# Patient Record
Sex: Female | Born: 1980 | Race: Black or African American | Hispanic: No | Marital: Single | Smoking: Current every day smoker
Health system: Southern US, Community
[De-identification: ages and names within clinical notes are randomized; demographics above are authoritative.]

## PROBLEM LIST (undated history)

## (undated) DIAGNOSIS — IMO0001 Reserved for inherently not codable concepts without codable children: Secondary | ICD-10-CM

## (undated) DIAGNOSIS — K0889 Other specified disorders of teeth and supporting structures: Secondary | ICD-10-CM

## (undated) DIAGNOSIS — N920 Excessive and frequent menstruation with regular cycle: Secondary | ICD-10-CM

## (undated) DIAGNOSIS — G039 Meningitis, unspecified: Secondary | ICD-10-CM

## (undated) HISTORY — PX: NO PAST SURGERIES: SHX2092

## (undated) HISTORY — PX: UTERINE FIBROID SURGERY: SHX826

---

## 2003-12-25 ENCOUNTER — Emergency Department: Payer: Self-pay | Admitting: Emergency Medicine

## 2004-03-05 ENCOUNTER — Emergency Department: Payer: Self-pay | Admitting: Internal Medicine

## 2004-10-02 ENCOUNTER — Emergency Department: Payer: Self-pay | Admitting: Internal Medicine

## 2004-10-03 ENCOUNTER — Emergency Department: Payer: Self-pay | Admitting: Emergency Medicine

## 2005-05-25 ENCOUNTER — Emergency Department: Payer: Self-pay | Admitting: Emergency Medicine

## 2005-10-19 ENCOUNTER — Emergency Department: Payer: Self-pay | Admitting: Emergency Medicine

## 2005-10-30 ENCOUNTER — Emergency Department: Payer: Self-pay | Admitting: Unknown Physician Specialty

## 2005-10-30 ENCOUNTER — Other Ambulatory Visit: Payer: Self-pay

## 2005-11-23 ENCOUNTER — Emergency Department: Payer: Self-pay | Admitting: Emergency Medicine

## 2006-03-04 ENCOUNTER — Emergency Department: Payer: Self-pay | Admitting: Internal Medicine

## 2006-03-04 ENCOUNTER — Other Ambulatory Visit: Payer: Self-pay

## 2006-10-24 ENCOUNTER — Emergency Department: Payer: Self-pay | Admitting: Emergency Medicine

## 2007-01-17 ENCOUNTER — Emergency Department: Payer: Self-pay | Admitting: Emergency Medicine

## 2007-08-26 ENCOUNTER — Emergency Department: Payer: Self-pay | Admitting: Unknown Physician Specialty

## 2008-12-10 ENCOUNTER — Emergency Department: Payer: Self-pay | Admitting: Internal Medicine

## 2009-03-06 ENCOUNTER — Emergency Department: Payer: Self-pay | Admitting: Emergency Medicine

## 2009-09-27 ENCOUNTER — Emergency Department: Payer: Self-pay | Admitting: Emergency Medicine

## 2013-08-01 ENCOUNTER — Emergency Department: Payer: Self-pay | Admitting: Internal Medicine

## 2013-08-01 LAB — URINALYSIS, COMPLETE
Bilirubin,UR: NEGATIVE
GLUCOSE, UR: NEGATIVE mg/dL (ref 0–75)
Nitrite: NEGATIVE
Ph: 5 (ref 4.5–8.0)
RBC,UR: 5 /HPF (ref 0–5)
SPECIFIC GRAVITY: 1.029 (ref 1.003–1.030)
Squamous Epithelial: 1

## 2013-08-01 LAB — COMPREHENSIVE METABOLIC PANEL
ANION GAP: 7 (ref 7–16)
Albumin: 3.5 g/dL (ref 3.4–5.0)
Alkaline Phosphatase: 69 U/L
BUN: 13 mg/dL (ref 7–18)
Bilirubin,Total: 0.3 mg/dL (ref 0.2–1.0)
CALCIUM: 8.4 mg/dL — AB (ref 8.5–10.1)
CHLORIDE: 108 mmol/L — AB (ref 98–107)
Co2: 22 mmol/L (ref 21–32)
Creatinine: 1.04 mg/dL (ref 0.60–1.30)
EGFR (Non-African Amer.): >60
GLUCOSE: 103 mg/dL — AB (ref 65–99)
OSMOLALITY: 274 (ref 275–301)
Potassium: 3.9 mmol/L (ref 3.5–5.1)
SGOT(AST): 25 U/L (ref 15–37)
SGPT (ALT): 25 U/L (ref 12–78)
SODIUM: 137 mmol/L (ref 136–145)
Total Protein: 7.7 g/dL (ref 6.4–8.2)

## 2013-08-01 LAB — DRUG SCREEN, URINE
AMPHETAMINES, UR SCREEN: NEGATIVE (ref ?–1000)
Barbiturates, Ur Screen: NEGATIVE (ref ?–200)
Benzodiazepine, Ur Scrn: NEGATIVE (ref ?–200)
Cannabinoid 50 Ng, Ur ~~LOC~~: POSITIVE (ref ?–50)
Cocaine Metabolite,Ur ~~LOC~~: NEGATIVE (ref ?–300)
MDMA (Ecstasy)Ur Screen: NEGATIVE (ref ?–500)
METHADONE, UR SCREEN: NEGATIVE (ref ?–300)
Opiate, Ur Screen: NEGATIVE (ref ?–300)
PHENCYCLIDINE (PCP) UR S: NEGATIVE (ref ?–25)
Tricyclic, Ur Screen: NEGATIVE (ref ?–1000)

## 2013-08-01 LAB — CBC
HCT: 39 % (ref 35.0–47.0)
HGB: 12.7 g/dL (ref 12.0–16.0)
MCH: 27.1 pg (ref 26.0–34.0)
MCHC: 32.6 g/dL (ref 32.0–36.0)
MCV: 83 fL (ref 80–100)
Platelet: 281 10*3/uL (ref 150–440)
RBC: 4.69 10*6/uL (ref 3.80–5.20)
RDW: 15.6 % — ABNORMAL HIGH (ref 11.5–14.5)
WBC: 6.6 10*3/uL (ref 3.6–11.0)

## 2013-08-01 LAB — LIPASE, BLOOD: Lipase: 159 U/L (ref 73–393)

## 2014-03-12 ENCOUNTER — Emergency Department: Payer: Self-pay | Admitting: Emergency Medicine

## 2015-01-07 ENCOUNTER — Emergency Department
Admission: EM | Admit: 2015-01-07 | Discharge: 2015-01-07 | Disposition: A | Discharge status: Home / Self Care | Payer: Self-pay | Attending: Emergency Medicine | Admitting: Emergency Medicine

## 2015-01-07 ENCOUNTER — Emergency Department: Payer: Self-pay

## 2015-01-07 ENCOUNTER — Encounter: Payer: Self-pay | Admitting: Emergency Medicine

## 2015-01-07 DIAGNOSIS — F17200 Nicotine dependence, unspecified, uncomplicated: Secondary | ICD-10-CM | POA: Insufficient documentation

## 2015-01-07 DIAGNOSIS — R109 Unspecified abdominal pain: Secondary | ICD-10-CM | POA: Insufficient documentation

## 2015-01-07 DIAGNOSIS — G44219 Episodic tension-type headache, not intractable: Secondary | ICD-10-CM | POA: Insufficient documentation

## 2015-01-07 DIAGNOSIS — Z3202 Encounter for pregnancy test, result negative: Secondary | ICD-10-CM | POA: Insufficient documentation

## 2015-01-07 LAB — URINALYSIS COMPLETE WITH MICROSCOPIC (ARMC ONLY)
Bacteria, UA: NONE SEEN
Bilirubin Urine: NEGATIVE
Glucose, UA: NEGATIVE mg/dL
Hgb urine dipstick: NEGATIVE
Ketones, ur: NEGATIVE mg/dL
Nitrite: NEGATIVE
Protein, ur: NEGATIVE mg/dL
Specific Gravity, Urine: 1.018 (ref 1.005–1.030)
pH: 6 (ref 5.0–8.0)

## 2015-01-07 LAB — COMPREHENSIVE METABOLIC PANEL
ALBUMIN: 3.7 g/dL (ref 3.5–5.0)
ALT: 19 U/L (ref 14–54)
ANION GAP: 4 — AB (ref 5–15)
AST: 20 U/L (ref 15–41)
Alkaline Phosphatase: 58 U/L (ref 38–126)
BUN: 11 mg/dL (ref 6–20)
CHLORIDE: 108 mmol/L (ref 101–111)
CO2: 26 mmol/L (ref 22–32)
Calcium: 8.8 mg/dL — ABNORMAL LOW (ref 8.9–10.3)
Creatinine, Ser: 0.86 mg/dL (ref 0.44–1.00)
GFR calc Af Amer: >60 mL/min (ref >60)
GFR calc non Af Amer: >60 mL/min (ref >60)
GLUCOSE: 97 mg/dL (ref 65–99)
POTASSIUM: 3.7 mmol/L (ref 3.5–5.1)
SODIUM: 138 mmol/L (ref 135–145)
Total Bilirubin: 0.3 mg/dL (ref 0.3–1.2)
Total Protein: 7.2 g/dL (ref 6.5–8.1)

## 2015-01-07 LAB — CBC
HEMATOCRIT: 32.1 % — AB (ref 35.0–47.0)
HEMOGLOBIN: 10 g/dL — AB (ref 12.0–16.0)
MCH: 23.5 pg — ABNORMAL LOW (ref 26.0–34.0)
MCHC: 31.2 g/dL — AB (ref 32.0–36.0)
MCV: 75.3 fL — AB (ref 80.0–100.0)
Platelets: 308 10*3/uL (ref 150–440)
RBC: 4.26 MIL/uL (ref 3.80–5.20)
RDW: 16.8 % — AB (ref 11.5–14.5)
WBC: 4.1 10*3/uL (ref 3.6–11.0)

## 2015-01-07 LAB — POCT PREGNANCY, URINE: Preg Test, Ur: NEGATIVE

## 2015-01-07 LAB — LIPASE, BLOOD: Lipase: 30 U/L (ref 11–51)

## 2015-01-07 MED ORDER — BUTALBITAL-APAP-CAFFEINE 50-325-40 MG PO TABS
2.0000 | ORAL_TABLET | Freq: Once | ORAL | Status: AC
Start: 2015-01-07 — End: 2015-01-07
  Administered 2015-01-07: 2 via ORAL
  Filled 2015-01-07: qty 2

## 2015-01-07 MED ORDER — KETOROLAC TROMETHAMINE 60 MG/2ML IM SOLN
60.0000 mg | Freq: Once | INTRAMUSCULAR | Status: AC
  Administered 2015-01-07: 60 mg via INTRAMUSCULAR
  Filled 2015-01-07: qty 2

## 2015-01-07 MED ORDER — BUTALBITAL-APAP-CAFFEINE 50-325-40 MG PO TABS: 1.0000 | ORAL_TABLET | Freq: Four times a day (QID) | ORAL | Status: DC | PRN

## 2015-01-07 MED ORDER — IOHEXOL 240 MG/ML SOLN
25.0000 mL | Freq: Once | INTRAMUSCULAR | Status: AC | PRN
  Administered 2015-01-07: 25 mL via ORAL
  Filled 2015-01-07: qty 25

## 2015-01-07 MED ORDER — IOHEXOL 300 MG/ML  SOLN
100.0000 mL | Freq: Once | INTRAMUSCULAR | Status: AC | PRN
  Administered 2015-01-07: 100 mL via INTRAVENOUS
  Filled 2015-01-07: qty 100

## 2015-01-07 NOTE — ED Notes (Signed)
Patient transported to CT 

## 2015-01-07 NOTE — ED Provider Notes (Signed)
[facility] Emergency Department Provider Note  ____________________________________________  Time seen: Approximately 3:33 PM  I have reviewed the triage vital signs and the nursing notes.   HISTORY  Chief Complaint Headache   HPI Kendra Higgins is a 34 y.o. female presents for evaluation of a headache times several weeks. Patient states that she's tried Advil with no relief denies any nausea vomiting no dizziness no change of vision. In addition patient states that she's got this abdominal mass that she's noted for the past year. Denies any change in bowel movements denies being pregnant.   History reviewed. No pertinent past medical history.  There are no active problems to display for this patient.   History reviewed. No pertinent past surgical history.  No current outpatient prescriptions on file.  Allergies Tramadol  Family History  Problem Relation Age of Onset  . CVA Mother     Social History Social History  Substance Use Topics  . Smoking status: Current Every Day Smoker  . Smokeless tobacco: None  . Alcohol Use: No    Review of Systems Constitutional: No fever/chills Eyes: No visual changes. ENT: No sore throat. Cardiovascular: Denies chest pain. Respiratory: Denies shortness of breath. Gastrointestinal: No abdominal pain.  No nausea, no vomiting.  No diarrhea.  No constipation. Positive for l"Knot on the right side of her abdomen". Genitourinary: Negative for dysuria. Musculoskeletal: Negative for back pain. Skin: Negative for rash. Neurological: Negative for headaches, focal weakness or numbness.  10-point ROS otherwise negative.  ____________________________________________   PHYSICAL EXAM:  VITAL SIGNS: ED Triage Vitals  Enc Vitals Group     BP 01/07/15 1427 160/104 mmHg     Pulse Rate 01/07/15 1427 90     Resp 01/07/15 1427 20     Temp 01/07/15 1427 98.2 F (36.8 C)     Temp Source 01/07/15 1427 Oral     SpO2  01/07/15 1427 98 %     Weight 01/07/15 1427 201 lb (91.173 kg)     Height 01/07/15 1427 5\' 5"  (1.651 m)     Head Cir --      Peak Flow --      Pain Score 01/07/15 1428 7     Pain Loc --      Pain Edu? --      Excl. in [place]? --     Constitutional: Alert and oriented. Well appearing and in no acute distress. Eyes: Conjunctivae are normal. PERRL. EOMI. Head: Atraumatic. Nose: No congestion/rhinnorhea. Mouth/Throat: Mucous membranes are moist.  Oropharynx non-erythematous. Neck: No stridor.   Cardiovascular: Normal rate, regular rhythm. Grossly normal heart sounds.  Good peripheral circulation. Respiratory: Normal respiratory effort.  No retractions. Lungs CTAB. Gastrointestinal: Soft and nontender. No distention. No abdominal bruits. No CVA tenderness. Complete exam limited due to body habitus. Musculoskeletal: No lower extremity tenderness nor edema.  No joint effusions. Neurologic:  Normal speech and language. No gross focal neurologic deficits are appreciated. No gait instability. Skin:  Skin is warm, dry and intact. No rash noted. Psychiatric: Mood and affect are normal. Speech and behavior are normal.  ____________________________________________   LABS (all labs ordered are listed, but only abnormal results are displayed)  Labs Reviewed  COMPREHENSIVE METABOLIC PANEL - Abnormal; Notable for the following:    Calcium 8.8 (*)    Anion gap 4 (*)    All other components within normal limits  CBC - Abnormal; Notable for the following:    Hemoglobin 10.0 (*)    HCT  32.1 (*)    MCV 75.3 (*)    MCH 23.5 (*)    MCHC 31.2 (*)    RDW 16.8 (*)    All other components within normal limits  URINALYSIS COMPLETEWITH MICROSCOPIC ([facility] ONLY) - Abnormal; Notable for the following:    Color, Urine YELLOW (*)    APPearance CLEAR (*)    Leukocytes, UA TRACE (*)    Squamous Epithelial / LPF 0-5 (*)    All other components within normal limits  LIPASE, BLOOD  POC URINE PREG, ED  POCT  PREGNANCY, URINE   ____________________________________________  RADIOLOGY  Abdomen 2 view demonstrates either an enlarged uterus or mass. No differential given. Will obtain an abdomen CT with contrast  .IMPRESSION: Abdomen CT with contrast Bulky uterus consistent with uterine fibroid change. This corresponds to the density seen on the recent plain film examination.   ____________________________________________   PROCEDURES  Procedure(s) performed: None  Critical Care performed: No  ____________________________________________   INITIAL IMPRESSION / ASSESSMENT AND PLAN / ED COURSE  Pertinent labs & imaging results that were available during my care of the patient were reviewed by me and considered in my medical decision making (see chart for details).  Tension headache resolved prior to discharged after Fioricet. Uterine fibroids noted by CT exam patient follow-up with OB/GYN as needed. Patient voices no other emergency medical complaints at this time. Rx given for Fioricet. ____________________________________________   FINAL CLINICAL IMPRESSION(S) / ED DIAGNOSES  Final diagnoses:  None      Arlyss Repress, PA-C 01/07/15 1726  Nance Pear, MD 01/07/15 1728

## 2015-01-07 NOTE — Discharge Instructions (Signed)
Tension Headache A tension headache is a feeling of pain, pressure, or aching that is often felt over the front and sides of the head. The pain can be dull, or it can feel tight (constricting). Tension headaches are not normally associated with nausea or vomiting, and they do not get worse with physical activity. Tension headaches can last from 30 minutes to several days. This is the most common type of headache. CAUSES The exact cause of this condition is not known. Tension headaches often begin after stress, anxiety, or depression. Other triggers may include:  Alcohol.  Too much caffeine, or caffeine withdrawal.  Respiratory infections, such as colds, flu, or sinus infections.  Dental problems or teeth clenching.  Fatigue.  Holding your head and neck in the same position for a long period of time, such as while using a computer.  Smoking. SYMPTOMS Symptoms of this condition include:  A feeling of pressure around the head.  Dull, aching head pain.  Pain felt over the front and sides of the head.  Tenderness in the muscles of the head, neck, and shoulders. DIAGNOSIS This condition may be diagnosed based on your symptoms and a physical exam. Tests may be done, such as a CT scan or an MRI of your head. These tests may be done if your symptoms are severe or unusual. TREATMENT This condition may be treated with lifestyle changes and medicines to help relieve symptoms. HOME CARE INSTRUCTIONS Managing Pain  Take over-the-counter and prescription medicines only as told by your health care provider.  Lie down in a dark, quiet room when you have a headache.  If directed, apply ice to the head and neck area:  Put ice in a plastic bag.  Place a towel between your skin and the bag.  Leave the ice on for 20 minutes, 2-3 times per day.  Use a heating pad or a hot shower to apply heat to the head and neck area as told by your health care provider. Eating and Drinking  Eat meals on  a regular schedule.  Limit alcohol use.  Decrease your caffeine intake, or stop using caffeine. General Instructions  Keep all follow-up visits as told by your health care provider. This is important.  Keep a headache journal to help find out what may trigger your headaches. For example, write down:  What you eat and drink.  How much sleep you get.  Any change to your diet or medicines.  Try massage or other relaxation techniques.  Limit stress.  Sit up straight, and avoid tensing your muscles.  Do not use tobacco products, including cigarettes, chewing tobacco, or e-cigarettes. If you need help quitting, ask your health care provider.  Exercise regularly as told by your health care provider.  Get 7-9 hours of sleep, or the amount recommended by your health care provider. SEEK MEDICAL CARE IF:  Your symptoms are not helped by medicine.  You have a headache that is different from what you normally experience.  You have nausea or you vomit.  You have a fever. SEEK IMMEDIATE MEDICAL CARE IF:  Your headache becomes severe.  You have repeated vomiting.  You have a stiff neck.  You have a loss of vision.  You have problems with speech.  You have pain in your eye or ear.  You have muscular weakness or loss of muscle control.  You lose your balance or you have trouble walking.  You feel faint or you pass out.  You have confusion.     This information is not intended to replace advice given to you by your health care provider. Make sure you discuss any questions you have with your health care provider.   Document Released: 01/04/2005 Document Revised: 09/25/2014 Document Reviewed: 04/29/2014 Elsevier Interactive Patient Education 2016 [employer]  

## 2015-01-07 NOTE — ED Notes (Signed)
Pt to ed with c/o headache x several weeks.  Pt states she has tried advil without relief.  Pt also reports she has a knot in her right side of her abd that she would like to have checked as well.

## 2015-06-19 ENCOUNTER — Encounter: Payer: Self-pay | Admitting: Emergency Medicine

## 2015-06-19 DIAGNOSIS — R110 Nausea: Secondary | ICD-10-CM | POA: Insufficient documentation

## 2015-06-19 DIAGNOSIS — R1011 Right upper quadrant pain: Secondary | ICD-10-CM | POA: Insufficient documentation

## 2015-06-19 DIAGNOSIS — D259 Leiomyoma of uterus, unspecified: Secondary | ICD-10-CM | POA: Insufficient documentation

## 2015-06-19 DIAGNOSIS — F17200 Nicotine dependence, unspecified, uncomplicated: Secondary | ICD-10-CM | POA: Insufficient documentation

## 2015-06-19 LAB — POCT PREGNANCY, URINE: Preg Test, Ur: NEGATIVE

## 2015-06-19 LAB — COMPREHENSIVE METABOLIC PANEL
ALK PHOS: 67 U/L (ref 38–126)
ALT: 16 U/L (ref 14–54)
AST: 21 U/L (ref 15–41)
Albumin: 4.3 g/dL (ref 3.5–5.0)
Anion gap: 11 (ref 5–15)
BILIRUBIN TOTAL: 0.4 mg/dL (ref 0.3–1.2)
BUN: 16 mg/dL (ref 6–20)
CHLORIDE: 102 mmol/L (ref 101–111)
CO2: 23 mmol/L (ref 22–32)
CREATININE: 0.86 mg/dL (ref 0.44–1.00)
Calcium: 9.9 mg/dL (ref 8.9–10.3)
Glucose, Bld: 148 mg/dL — ABNORMAL HIGH (ref 65–99)
Potassium: 3.8 mmol/L (ref 3.5–5.1)
Sodium: 136 mmol/L (ref 135–145)
Total Protein: 9.1 g/dL — ABNORMAL HIGH (ref 6.5–8.1)

## 2015-06-19 LAB — LIPASE, BLOOD: LIPASE: 26 U/L (ref 11–51)

## 2015-06-19 LAB — CBC
HEMATOCRIT: 32.3 % — AB (ref 35.0–47.0)
Hemoglobin: 10.2 g/dL — ABNORMAL LOW (ref 12.0–16.0)
MCH: 21.3 pg — AB (ref 26.0–34.0)
MCHC: 31.7 g/dL — AB (ref 32.0–36.0)
MCV: 67.4 fL — AB (ref 80.0–100.0)
PLATELETS: 415 10*3/uL (ref 150–440)
RBC: 4.78 MIL/uL (ref 3.80–5.20)
RDW: 19.4 % — AB (ref 11.5–14.5)
WBC: 7.1 10*3/uL (ref 3.6–11.0)

## 2015-06-19 NOTE — ED Notes (Addendum)
Pt presents to ED with c/o emesis then epigastric pain when vomiting, onset 3 days. Pt also reports cough with yellow and green sputum. Denies diarrhea. State in period now.

## 2015-06-20 ENCOUNTER — Emergency Department: Payer: Self-pay

## 2015-06-20 ENCOUNTER — Emergency Department
Admission: EM | Admit: 2015-06-20 | Discharge: 2015-06-20 | Disposition: A | Discharge status: Home / Self Care | Payer: Self-pay | Attending: Emergency Medicine | Admitting: Emergency Medicine

## 2015-06-20 ENCOUNTER — Encounter: Payer: Self-pay | Admitting: Radiology

## 2015-06-20 DIAGNOSIS — R110 Nausea: Secondary | ICD-10-CM

## 2015-06-20 DIAGNOSIS — R1011 Right upper quadrant pain: Secondary | ICD-10-CM

## 2015-06-20 DIAGNOSIS — D259 Leiomyoma of uterus, unspecified: Secondary | ICD-10-CM

## 2015-06-20 LAB — URINALYSIS COMPLETE WITH MICROSCOPIC (ARMC ONLY)
BILIRUBIN URINE: NEGATIVE
GLUCOSE, UA: 50 mg/dL — AB
Hgb urine dipstick: NEGATIVE
NITRITE: NEGATIVE
Protein, ur: 100 mg/dL — AB
RBC / HPF: NONE SEEN RBC/hpf (ref 0–5)
SPECIFIC GRAVITY, URINE: 1.03 (ref 1.005–1.030)
WBC, UA: NONE SEEN WBC/hpf (ref 0–5)
pH: 5 (ref 5.0–8.0)

## 2015-06-20 MED ORDER — OXYCODONE-ACETAMINOPHEN 5-325 MG PO TABS: 1.0000 | ORAL_TABLET | ORAL | Status: DC | PRN

## 2015-06-20 MED ORDER — ONDANSETRON 4 MG PO TBDP: 4.0000 mg | ORAL_TABLET | Freq: Three times a day (TID) | ORAL | Status: DC | PRN

## 2015-06-20 MED ORDER — MORPHINE SULFATE (PF) 2 MG/ML IV SOLN
2.0000 mg | Freq: Once | INTRAVENOUS | Status: AC
  Administered 2015-06-20: 2 mg via INTRAVENOUS
  Filled 2015-06-20: qty 1

## 2015-06-20 MED ORDER — DIATRIZOATE MEGLUMINE & SODIUM 66-10 % PO SOLN
15.0000 mL | Freq: Once | ORAL | Status: AC
  Administered 2015-06-20: 15 mL via ORAL

## 2015-06-20 MED ORDER — IOPAMIDOL (ISOVUE-300) INJECTION 61%
100.0000 mL | Freq: Once | INTRAVENOUS | Status: AC | PRN
  Administered 2015-06-20: 100 mL via INTRAVENOUS

## 2015-06-20 MED ORDER — ONDANSETRON HCL 4 MG/2ML IJ SOLN
4.0000 mg | Freq: Once | INTRAMUSCULAR | Status: AC
  Administered 2015-06-20: 4 mg via INTRAVENOUS
  Filled 2015-06-20: qty 2

## 2015-06-20 NOTE — Discharge Instructions (Signed)
Uterine Fibroids Uterine fibroids are tissue masses (tumors) that can develop in the womb (uterus). They are also called leiomyomas. This type of tumor is not cancerous (benign) and does not spread to other parts of the body outside of the pelvic area, which is between the hip bones. Occasionally, fibroids may develop in the fallopian tubes, in the cervix, or on the support structures (ligaments) that surround the uterus. You can have one or many fibroids. Fibroids can vary in size, weight, and where they grow in the uterus. Some can become quite large. Most fibroids do not require medical treatment. CAUSES A fibroid can develop when a single uterine cell keeps growing (replicating). Most cells in the human body have a control mechanism that keeps them from replicating without control. SIGNS AND SYMPTOMS Symptoms may include:   Heavy bleeding during your period.  Bleeding or spotting between periods.  Pelvic pain and pressure.  Bladder problems, such as needing to urinate more often (urinary frequency) or urgently.  Inability to reproduce offspring (infertility).  Miscarriages. DIAGNOSIS Uterine fibroids are diagnosed through a physical exam. Your health care provider may feel the lumpy tumors during a pelvic exam. Ultrasonography and an MRI may be done to determine the size, location, and number of fibroids. TREATMENT Treatment may include:  Watchful waiting. This involves getting the fibroid checked by your health care provider to see if it grows or shrinks. Follow your health care provider's recommendations for how often to have this checked.  Hormone medicines. These can be taken by mouth or given through an intrauterine device (IUD).  Surgery.  Removing the fibroids (myomectomy) or the uterus (hysterectomy).  Removing blood supply to the fibroids (uterine artery embolization). If fibroids interfere with your fertility and you want to become pregnant, your health care provider  may recommend having the fibroids removed.  HOME CARE INSTRUCTIONS  Keep all follow-up visits as directed by your health care provider. This is important.  Take medicines only as directed by your health care provider.  If you were prescribed a hormone treatment, take the hormone medicines exactly as directed.  Do not take aspirin, because it can cause bleeding.  Ask your health care provider about taking iron pills and increasing the amount of dark green, leafy vegetables in your diet. These actions can help to boost your blood iron levels, which may be affected by heavy menstrual bleeding.  Pay close attention to your period and tell your health care provider about any changes, such as:  Increased blood flow that requires you to use more pads or tampons than usual per month.  A change in the number of days that your period lasts per month.  A change in symptoms that are associated with your period, such as abdominal cramping or back pain. SEEK MEDICAL CARE IF:  You have pelvic pain, back pain, or abdominal cramps that cannot be controlled with medicines.  You have an increase in bleeding between and during periods.  You soak tampons or pads in a half hour or less.  You feel lightheaded, extra tired, or weak. SEEK IMMEDIATE MEDICAL CARE IF:  You faint.  You have a sudden increase in pelvic pain.   This information is not intended to replace advice given to you by your health care provider. Make sure you discuss any questions you have with your health care provider.   Document Released: 01/02/2000 Document Revised: 01/25/2014 Document Reviewed: 07/03/2013 Elsevier Interactive Patient Education 2016 Elsevier Inc.  

## 2015-06-20 NOTE — ED Provider Notes (Signed)
[facility] Emergency Department Provider Note  ____________________________________________  Time seen: 1:30 AM  I have reviewed the triage vital signs and the nursing notes.   HISTORY  Chief Complaint Emesis     HPI Anglea Chauncey Cruel Higgins is a 35 y.o. female presents with suprapubic abdominal pain accompanied by vomiting 3 days. Patient denies any diarrhea no fever. Patient denies any dysuria or urinary frequency or urgency. Patient denies any diarrhea. Of note patient's currently menstruating     Past medical history None There are no active problems to display for this patient.   Past surgical history No pertinent past surgical history  Current Outpatient Rx  Name  Route  Sig  Dispense  Refill  . ondansetron (ZOFRAN ODT) 4 MG disintegrating tablet   Oral   Take 1 tablet (4 mg total) by mouth every 8 (eight) hours as needed for nausea or vomiting.   20 tablet   0   . oxyCODONE-acetaminophen (ROXICET) 5-325 MG tablet   Oral   Take 1 tablet by mouth every 4 (four) hours as needed for severe pain.   20 tablet   0     Allergies Tramadol  Family History  Problem Relation Age of Onset  . CVA Mother     Social History Social History  Substance Use Topics  . Smoking status: Current Every Day Smoker  . Smokeless tobacco: None  . Alcohol Use: No    Review of Systems  Constitutional: Negative for fever. Eyes: Negative for visual changes. ENT: Negative for sore throat. Cardiovascular: Negative for chest pain. Respiratory: Negative for shortness of breath. Gastrointestinal: Positive for pelvic pain and vomiting Genitourinary: Negative for dysuria. Musculoskeletal: Negative for back pain. Skin: Negative for rash. Neurological: Negative for headaches, focal weakness or numbness.   10-point ROS otherwise negative.  ____________________________________________   PHYSICAL EXAM:  VITAL SIGNS: ED Triage Vitals  Enc Vitals Group     BP  06/19/15 2312 149/81 mmHg     Pulse Rate 06/19/15 2312 99     Resp 06/19/15 2312 18     Temp 06/19/15 2312 98.5 F (36.9 C)     Temp Source 06/19/15 2312 Oral     SpO2 06/19/15 2312 98 %     Weight --      Height --      Head Cir --      Peak Flow --      Pain Score 06/19/15 2318 0     Pain Loc --      Pain Edu? --      Excl. in [place]? --      Constitutional: Alert and oriented. Well appearing and in no distress. Eyes: Conjunctivae are normal. PERRL. Normal extraocular movements. ENT   Head: Normocephalic and atraumatic.   Nose: No congestion/rhinnorhea.   Mouth/Throat: Mucous membranes are moist.   Neck: No stridor. Hematological/Lymphatic/Immunilogical: No cervical lymphadenopathy. Cardiovascular: Normal rate, regular rhythm. Normal and symmetric distal pulses are present in all extremities. No murmurs, rubs, or gallops. Respiratory: Normal respiratory effort without tachypnea nor retractions. Breath sounds are clear and equal bilaterally. No wheezes/rales/rhonchi. Gastrointestinal: Suprapubic discomfort with palpation. No distention. There is no CVA tenderness. Genitourinary: deferred Musculoskeletal: Nontender with normal range of motion in all extremities. No joint effusions.  No lower extremity tenderness nor edema. Neurologic:  Normal speech and language. No gross focal neurologic deficits are appreciated. Speech is normal.  Skin:  Skin is warm, dry and intact. No rash noted. Psychiatric: Mood and affect  are normal. Speech and behavior are normal. Patient exhibits appropriate insight and judgment.  ____________________________________________    LABS (pertinent positives/negatives)  Labs Reviewed  COMPREHENSIVE METABOLIC PANEL - Abnormal; Notable for the following:    Glucose, Bld 148 (*)    Total Protein 9.1 (*)    All other components within normal limits  CBC - Abnormal; Notable for the following:    Hemoglobin 10.2 (*)    HCT 32.3 (*)    MCV 67.4  (*)    MCH 21.3 (*)    MCHC 31.7 (*)    RDW 19.4 (*)    All other components within normal limits  URINALYSIS COMPLETEWITH MICROSCOPIC ([facility] ONLY) - Abnormal; Notable for the following:    Color, Urine YELLOW (*)    APPearance TURBID (*)    Glucose, UA 50 (*)    Ketones, ur 1+ (*)    Protein, ur 100 (*)    Leukocytes, UA TRACE (*)    Bacteria, UA RARE (*)    Squamous Epithelial / LPF 0-5 (*)    All other components within normal limits  LIPASE, BLOOD  POC URINE PREG, ED  POCT PREGNANCY, URINE         RADIOLOGY      CT Abdomen Pelvis W Contrast (Final result) Result time: 06/20/15 03:33:35   Final result by Rad Results In Interface (06/20/15 03:33:35)   Narrative:   CLINICAL DATA: Acute onset of epigastric abdominal pain and vomiting. Productive cough. Initial encounter.  EXAM: CT ABDOMEN AND PELVIS WITH CONTRAST  TECHNIQUE: Multidetector CT imaging of the abdomen and pelvis was performed using the standard protocol following bolus administration of intravenous contrast.  CONTRAST: 199mL ISOVUE-300 IOPAMIDOL (ISOVUE-300) INJECTION 61%  COMPARISON: CT of the abdomen and pelvis from 01/07/2015, and right upper quadrant ultrasound performed earlier today at 2:15 a.m.  FINDINGS: The visualized lung bases are clear.  The liver and spleen are unremarkable in appearance. The gallbladder is within normal limits. The pancreas and adrenal glands are unremarkable.  The kidneys are unremarkable in appearance. There is no evidence of hydronephrosis. No renal or ureteral stones are seen. No perinephric stranding is appreciated.  No free fluid is identified. The small bowel is unremarkable in appearance. The stomach is within normal limits. No acute vascular abnormalities are seen.  The appendix is normal in caliber and contains air, without evidence of appendicitis. The colon is grossly unremarkable in appearance.  The bladder is mildly distended. The uterus  is significantly enlarged, extending above the level of the umbilicus, with multiple large uterine fibroids. Some of these demonstrate new degeneration since the prior study. The ovaries are grossly unremarkable in appearance. A tampon is noted at the vagina. No inguinal lymphadenopathy is seen.  No acute osseous abnormalities are identified.  IMPRESSION: 1. No acute abnormality seen within the abdomen or pelvis. 2. Significantly enlarged uterus, extending above the level of the umbilicus, with multiple large uterine fibroids. Some of these demonstrate new degeneration since the prior study.   Electronically Signed By: Garald Balding M.D. On: 06/20/2015 03:33          US Abdomen Limited RUQ (Final result) Result time: 06/20/15 02:43:26   Final result by Rad Results In Interface (06/20/15 02:43:26)   Narrative:   CLINICAL DATA: Right upper quadrant pain and nausea.  EXAM: US ABDOMEN LIMITED - RIGHT UPPER QUADRANT  COMPARISON: CT 01/07/2015  FINDINGS: Gallbladder:  Physiologically distended. No gallstones or wall thickening visualized. No sonographic Murphy sign noted by sonographer.  Common bile duct:  Diameter: 2.8 mm, normal.  Liver:  No focal lesion identified. Within normal limits in parenchymal echogenicity. Normal directional flow in the main portal vein.  IMPRESSION: Normal right upper quadrant ultrasound. No gallstones.   Electronically Signed By: Jeb Levering M.D. On: 06/20/2015 02:43          INITIAL IMPRESSION / ASSESSMENT AND PLAN / ED COURSE  Pertinent labs & imaging results that were available during my care of the patient were reviewed by me and considered in my medical decision making (see chart for details).  Patient informed of all clinical findings including large uterine fibroids. Patient referred to Dr. Star [facility] for further outpatient evaluation and  management  ____________________________________________   FINAL CLINICAL IMPRESSION(S) / ED DIAGNOSES  Final diagnoses:  Nausea  Right upper quadrant pain  Uterine leiomyoma, unspecified location      Gregor Hams, MD 06/20/15 [phone]

## 2015-06-20 NOTE — ED Notes (Signed)
Patient transported to Ultrasound 

## 2015-08-13 ENCOUNTER — Encounter
Admission: RE | Admit: 2015-08-13 | Discharge: 2015-08-13 | Disposition: A | Discharge status: Home / Self Care | Payer: Self-pay | Source: Ambulatory Visit | Attending: Obstetrics & Gynecology | Admitting: Obstetrics & Gynecology

## 2015-08-13 ENCOUNTER — Other Ambulatory Visit: Payer: Self-pay

## 2015-08-13 DIAGNOSIS — Z01810 Encounter for preprocedural cardiovascular examination: Secondary | ICD-10-CM | POA: Insufficient documentation

## 2015-08-13 DIAGNOSIS — Z01812 Encounter for preprocedural laboratory examination: Secondary | ICD-10-CM | POA: Insufficient documentation

## 2015-08-13 HISTORY — DX: Reserved for inherently not codable concepts without codable children: IMO0001

## 2015-08-13 HISTORY — DX: Excessive and frequent menstruation with regular cycle: N92.0

## 2015-08-13 HISTORY — DX: Other specified disorders of teeth and supporting structures: K08.89

## 2015-08-13 LAB — CBC
HEMATOCRIT: 28 % — AB (ref 35.0–47.0)
HEMOGLOBIN: 8.8 g/dL — AB (ref 12.0–16.0)
MCH: 21 pg — AB (ref 26.0–34.0)
MCHC: 31.4 g/dL — AB (ref 32.0–36.0)
MCV: 66.9 fL — AB (ref 80.0–100.0)
Platelets: 311 10*3/uL (ref 150–440)
RBC: 4.18 MIL/uL (ref 3.80–5.20)
RDW: 20.8 % — ABNORMAL HIGH (ref 11.5–14.5)
WBC: 4.5 10*3/uL (ref 3.6–11.0)

## 2015-08-13 LAB — TYPE AND SCREEN
ABO/RH(D): O POS
Antibody Screen: NEGATIVE

## 2015-08-13 NOTE — Patient Instructions (Signed)
  Your procedure is scheduled on: 08/26/15 Tues Report to Same Day Surgery 2nd floor medical mall To find out your arrival time please call [phone] between 1PM - 3PM on 08/25/15 Mon  Remember: Instructions that are not followed completely may result in serious medical risk, up to and including death, or upon the discretion of your surgeon and anesthesiologist your surgery may need to be rescheduled.    _x___ 1. Do not eat food or drink liquids after midnight. No gum chewing or hard candies.     __x__ 2. No Alcohol for 24 hours before or after surgery.   __x__3. No Smoking for 24 prior to surgery.   ____  4. Bring all medications with you on the day of surgery if instructed.    __x__ 5. Notify your doctor if there is any change in your medical condition     (cold, fever, infections).     Do not wear jewelry, make-up, hairpins, clips or nail polish.  Do not wear lotions, powders, or perfumes. You may wear deodorant.  Do not shave 48 hours prior to surgery. Men may shave face and neck.  Do not bring valuables to the hospital.    [facility] is not responsible for any belongings or valuables.               Contacts, dentures or bridgework may not be worn into surgery.  Leave your suitcase in the car. After surgery it may be brought to your room.  For patients admitted to the hospital, discharge time is determined by your treatment team.   Patients discharged the day of surgery will not be allowed to drive home.    Please read over the following fact sheets that you were given:   [facility] Preparing for Surgery and or MRSA Information   _x___ Take these medicines the morning of surgery with A SIP OF WATER:    1. None  2.  3.  4.  5.  6.  ____ Fleet Enema (as directed)   _x___ Use CHG Soap or sage wipes as directed on instruction sheet   ____ Use inhalers on the day of surgery and bring to hospital day of surgery  ____ Stop metformin 2 days prior to surgery    ____  Take 1/2 of usual insulin dose the night before surgery and none on the morning of           surgery.   ____ Stop aspirin or coumadin, or plavix  _x__ Stop Anti-inflammatories such as Advil, Aleve, Ibuprofen, Motrin, Naproxen,          Naprosyn, Goodies powders or aspirin products. Ok to take Tylenol.   ____ Stop supplements until after surgery.    ____ Bring C-Pap to the hospital.

## 2015-08-13 NOTE — Pre-Procedure Instructions (Signed)
Received call from Ms. Bartling.  "States she will try and find a physician, make an appointment and call us back so we can send EKG and request for clearance to them.

## 2015-08-13 NOTE — Pre-Procedure Instructions (Signed)
Abnormal EKG; Dr Rosey Bath, anesthesia, notified.  Patient has no primary care physician.  Called and faxed request for clearance to Dr Kenton Kingfisher.  Called patient to inform her of need for medical clearance. Requested patient to notify pre admit testing so we could fax clearance request and EKG to a PCP.

## 2015-08-21 NOTE — Pre-Procedure Instructions (Addendum)
LEFT MESSAGE FOR NANCY AT [facility] RE STATUS OF CLEARANCE PATIENT SCHEDULED WITH [facility] BUT NO INSURANCE AND TRYING TO GET [facility]ONE[facility] TO BE SEEN. DR HARRIS DOES NOT WANT TO CANCEL UNLESS SURE CAN NOT BE CLEARED BEFORE 08/26/15

## 2015-08-25 NOTE — Pre-Procedure Instructions (Signed)
Message left on Kendra Higgins's voicemail for update on medical clearance for pt's surgery tomorrow.  No notes noted in Epic.  [name] requested to call back.

## 2015-08-25 NOTE — Pre-Procedure Instructions (Signed)
Kendra Higgins returned call, pt has not been able to see her medical doctor for clearance.  She is trying to get into see here PCP and will be rescheduled once clearance has been obtained.

## 2015-08-26 ENCOUNTER — Encounter: Admission: RE | Payer: Self-pay | Source: Ambulatory Visit

## 2015-08-26 ENCOUNTER — Inpatient Hospital Stay: Admission: RE | Admit: 2015-08-26 | Payer: Self-pay | Source: Ambulatory Visit | Admitting: Obstetrics & Gynecology

## 2015-08-26 SURGERY — MYOMECTOMY, ABDOMINAL APPROACH
Anesthesia: Choice

## 2015-10-09 ENCOUNTER — Encounter: Payer: Self-pay | Admitting: Emergency Medicine

## 2015-10-09 ENCOUNTER — Emergency Department
Admission: EM | Admit: 2015-10-09 | Discharge: 2015-10-09 | Disposition: A | Discharge status: Home / Self Care | Payer: Self-pay | Attending: Emergency Medicine | Admitting: Emergency Medicine

## 2015-10-09 DIAGNOSIS — K0889 Other specified disorders of teeth and supporting structures: Secondary | ICD-10-CM

## 2015-10-09 DIAGNOSIS — K047 Periapical abscess without sinus: Secondary | ICD-10-CM | POA: Insufficient documentation

## 2015-10-09 DIAGNOSIS — F17210 Nicotine dependence, cigarettes, uncomplicated: Secondary | ICD-10-CM | POA: Insufficient documentation

## 2015-10-09 MED ORDER — AMOXICILLIN 500 MG PO CAPS: 500.0000 mg | ORAL_CAPSULE | Freq: Three times a day (TID) | ORAL | 0 refills | Status: DC

## 2015-10-09 MED ORDER — IBUPROFEN 600 MG PO TABS: 600.0000 mg | ORAL_TABLET | Freq: Three times a day (TID) | ORAL | 0 refills | Status: DC | PRN

## 2015-10-09 MED ORDER — OXYCODONE-ACETAMINOPHEN 5-325 MG PO TABS: 1.0000 | ORAL_TABLET | ORAL | 0 refills | Status: DC | PRN

## 2015-10-09 NOTE — ED Provider Notes (Signed)
[facility] Emergency Department Provider Note   ____________________________________________   First MD Initiated Contact with Patient 10/09/15 2033     (approximate)  I have reviewed the triage vital signs and the nursing notes.   HISTORY  Chief Complaint Dental Pain    HPI Delancey Chauncey Cruel Conkey is a 35 y.o. female patient complaining of dental pain to the left lower molar for 3 days. Patient state there is some swelling in her left cheek. Patient states she contacted her treating Simona Huh and was told she couldn't get up over the next week. Patient required pain relief. Patient is rating the pain as 8/10. No palliative measures taken for this complaint. Patient described a pain as sharp.   Past Medical History:  Diagnosis Date  . Heavy menstrual bleeding   . Shortness of breath dyspnea   . Toothache     There are no active problems to display for this patient.   Past Surgical History:  Procedure Laterality Date  . NO PAST SURGERIES      Prior to Admission medications   Medication Sig Start Date End Date Taking? Authorizing Provider  amoxicillin (AMOXIL) 500 MG capsule Take 1 capsule (500 mg total) by mouth 3 (three) times daily. 10/09/15   Sable Feil, PA-C  ibuprofen (ADVIL,MOTRIN) 200 MG tablet Take 800 mg by mouth every 6 (six) hours as needed.    Historical Provider, MD  ibuprofen (ADVIL,MOTRIN) 600 MG tablet Take 1 tablet (600 mg total) by mouth every 8 (eight) hours as needed. 10/09/15   Sable Feil, PA-C  oxyCODONE-acetaminophen (ROXICET) 5-325 MG tablet Take 1 tablet by mouth every 4 (four) hours as needed for severe pain. 10/09/15   Sable Feil, PA-C    Allergies Tramadol  Family History  Problem Relation Age of Onset  . CVA Mother     Social History Social History  Substance Use Topics  . Smoking status: Current Every Day Smoker    Packs/day: 0.50    Years: 20.00    Types: Cigarettes  . Smokeless tobacco: Never Used  .  Alcohol use No    Review of Systems Constitutional: No fever/chills Eyes: No visual changes. ENT: No sore throat. Dental pain Cardiovascular: Denies chest pain. Respiratory: Denies shortness of breath. Gastrointestinal: No abdominal pain.  No nausea, no vomiting.  No diarrhea.  No constipation. Genitourinary: Negative for dysuria. Musculoskeletal: Negative for back pain. Skin: Negative for rash. Neurological: Negative for headaches, focal weakness or numbness.    ____________________________________________   PHYSICAL EXAM:  VITAL SIGNS: ED Triage Vitals  Enc Vitals Group     BP 10/09/15 1934 130/90     Pulse Rate 10/09/15 1934 (!) 104     Resp 10/09/15 1934 18     Temp 10/09/15 1934 99 F (37.2 C)     Temp Source 10/09/15 1934 Oral     SpO2 10/09/15 1934 100 %     Weight 10/09/15 1936 190 lb (86.2 kg)     Height 10/09/15 1936 5\' 4"  (1.626 m)     Head Circumference --      Peak Flow --      Pain Score 10/09/15 1954 8     Pain Loc --      Pain Edu? --      Excl. in [place]? --     Constitutional: Alert and oriented. Well appearing and in no acute distress. Eyes: Conjunctivae are normal. PERRL. EOMI. Head: Atraumatic. Nose: No congestion/rhinnorhea. Mouth/Throat: Mucous membranes  are moist.  Oropharynx erythematous, with  multiple caries and gingival edema. Neck: No stridor. No cervical spine tenderness to palpation. Hematological/Lymphatic/Immunilogical: No cervical lymphadenopathy. Cardiovascular: Normal rate, regular rhythm. Grossly normal heart sounds.  Good peripheral circulation. Respiratory: Normal respiratory effort.  No retractions. Lungs CTAB. Gastrointestinal: Soft and nontender. No distention. No abdominal bruits. No CVA tenderness. Musculoskeletal: No lower extremity tenderness nor edema.  No joint effusions. Neurologic:  Normal speech and language. No gross focal neurologic deficits are appreciated. No gait instability. Skin:  Skin is warm, dry and intact.  No rash noted. Psychiatric: Mood and affect are normal. Speech and behavior are normal.  ____________________________________________   LABS (all labs ordered are listed, but only abnormal results are displayed)  Labs Reviewed - No data to display ____________________________________________  EKG   ____________________________________________  RADIOLOGY   ____________________________________________   PROCEDURES  Procedure(s) performed: None  Procedures  Critical Care performed: No  ____________________________________________   INITIAL IMPRESSION / ASSESSMENT AND PLAN / ED COURSE  Pertinent labs & imaging results that were available during my care of the patient were reviewed by me and considered in my medical decision making (see chart for details).  Dental pain. Patient given discharge care instructions. Patient given a prescription for amoxicillin Percocets and ibuprofen. Patient advised follow-up with scheduled dental appointment with Dr. Nicola Girt.  Clinical Course     ____________________________________________   FINAL CLINICAL IMPRESSION(S) / ED DIAGNOSES  Final diagnoses:  Pain, dental  Dental infection      NEW MEDICATIONS STARTED DURING THIS VISIT:  New Prescriptions   AMOXICILLIN (AMOXIL) 500 MG CAPSULE    Take 1 capsule (500 mg total) by mouth 3 (three) times daily.   IBUPROFEN (ADVIL,MOTRIN) 600 MG TABLET    Take 1 tablet (600 mg total) by mouth every 8 (eight) hours as needed.   OXYCODONE-ACETAMINOPHEN (ROXICET) 5-325 MG TABLET    Take 1 tablet by mouth every 4 (four) hours as needed for severe pain.     Note:  This document was prepared using Dragon voice recognition software and may include unintentional dictation errors.    Sable Feil, PA-C 10/09/15 2041    Daymon Larsen, MD 10/09/15 2059

## 2015-10-09 NOTE — ED Triage Notes (Signed)
Pt c/o left bottom dental pain x3 days with drainage. Pt not able to get dental appointment, reported to ED for pain relief.

## 2015-10-09 NOTE — ED Notes (Signed)

## 2016-01-10 ENCOUNTER — Encounter: Payer: Self-pay | Admitting: Medical Oncology

## 2016-01-10 ENCOUNTER — Emergency Department
Admission: EM | Admit: 2016-01-10 | Discharge: 2016-01-10 | Disposition: A | Discharge status: Home / Self Care | Payer: Self-pay | Attending: Student in an Organized Health Care Education/Training Program | Admitting: Student in an Organized Health Care Education/Training Program

## 2016-01-10 DIAGNOSIS — Z79899 Other long term (current) drug therapy: Secondary | ICD-10-CM | POA: Insufficient documentation

## 2016-01-10 DIAGNOSIS — R1084 Generalized abdominal pain: Secondary | ICD-10-CM

## 2016-01-10 DIAGNOSIS — F17210 Nicotine dependence, cigarettes, uncomplicated: Secondary | ICD-10-CM | POA: Insufficient documentation

## 2016-01-10 DIAGNOSIS — D259 Leiomyoma of uterus, unspecified: Secondary | ICD-10-CM | POA: Insufficient documentation

## 2016-01-10 LAB — CBC
HCT: 30.3 % — ABNORMAL LOW (ref 35.0–47.0)
Hemoglobin: 9.6 g/dL — ABNORMAL LOW (ref 12.0–16.0)
MCH: 19.6 pg — AB (ref 26.0–34.0)
MCHC: 31.6 g/dL — AB (ref 32.0–36.0)
MCV: 62.1 fL — AB (ref 80.0–100.0)
PLATELETS: 311 10*3/uL (ref 150–440)
RBC: 4.88 MIL/uL (ref 3.80–5.20)
RDW: 19.8 % — ABNORMAL HIGH (ref 11.5–14.5)
WBC: 10.9 10*3/uL (ref 3.6–11.0)

## 2016-01-10 LAB — URINALYSIS, COMPLETE (UACMP) WITH MICROSCOPIC
Bacteria, UA: NONE SEEN
Bilirubin Urine: NEGATIVE
GLUCOSE, UA: NEGATIVE mg/dL
Hgb urine dipstick: NEGATIVE
KETONES UR: NEGATIVE mg/dL
Leukocytes, UA: NEGATIVE
Nitrite: NEGATIVE
PROTEIN: NEGATIVE mg/dL
Specific Gravity, Urine: 1.024 (ref 1.005–1.030)
pH: 5 (ref 5.0–8.0)

## 2016-01-10 LAB — COMPREHENSIVE METABOLIC PANEL
ALBUMIN: 3.9 g/dL (ref 3.5–5.0)
ALK PHOS: 67 U/L (ref 38–126)
ALT: 16 U/L (ref 14–54)
AST: 26 U/L (ref 15–41)
Anion gap: 7 (ref 5–15)
BUN: 15 mg/dL (ref 6–20)
CALCIUM: 9 mg/dL (ref 8.9–10.3)
CHLORIDE: 108 mmol/L (ref 101–111)
CO2: 20 mmol/L — AB (ref 22–32)
CREATININE: 0.78 mg/dL (ref 0.44–1.00)
GFR calc Af Amer: >60 mL/min (ref >60)
GFR calc non Af Amer: >60 mL/min (ref >60)
GLUCOSE: 107 mg/dL — AB (ref 65–99)
Potassium: 4.1 mmol/L (ref 3.5–5.1)
SODIUM: 135 mmol/L (ref 135–145)
Total Bilirubin: 1 mg/dL (ref 0.3–1.2)
Total Protein: 8.1 g/dL (ref 6.5–8.1)

## 2016-01-10 LAB — POCT PREGNANCY, URINE: Preg Test, Ur: NEGATIVE

## 2016-01-10 LAB — LIPASE, BLOOD: Lipase: 23 U/L (ref 11–51)

## 2016-01-10 MED ORDER — HYDROCODONE-ACETAMINOPHEN 5-325 MG PO TABS: 1.0000 | ORAL_TABLET | ORAL | 0 refills | Status: DC | PRN

## 2016-01-10 MED ORDER — PROMETHAZINE HCL 12.5 MG PO TABS: 12.5000 mg | ORAL_TABLET | Freq: Four times a day (QID) | ORAL | 0 refills | Status: DC | PRN

## 2016-01-10 MED ORDER — ONDANSETRON 4 MG PO TBDP
ORAL_TABLET | ORAL | Status: AC
  Administered 2016-01-10: 4 mg via ORAL
  Filled 2016-01-10: qty 1

## 2016-01-10 MED ORDER — ACETAMINOPHEN 325 MG PO TABS
650.0000 mg | ORAL_TABLET | Freq: Once | ORAL | Status: AC
  Administered 2016-01-10: 650 mg via ORAL
  Filled 2016-01-10: qty 2

## 2016-01-10 MED ORDER — POLYETHYLENE GLYCOL 3350 17 G PO PACK: 17.0000 g | PACK | Freq: Every day | ORAL | 0 refills | Status: DC

## 2016-01-10 MED ORDER — HYDROCODONE-ACETAMINOPHEN 10-325 MG PO TABS
2.0000 | ORAL_TABLET | Freq: Once | ORAL | Status: AC
  Administered 2016-01-10: 2 via ORAL
  Filled 2016-01-10: qty 2

## 2016-01-10 MED ORDER — ONDANSETRON 4 MG PO TBDP
4.0000 mg | ORAL_TABLET | Freq: Once | ORAL | Status: AC
  Administered 2016-01-10: 4 mg via ORAL

## 2016-01-10 MED ORDER — PROMETHAZINE HCL 25 MG PO TABS: 25.0000 mg | ORAL_TABLET | Freq: Once | ORAL | Status: DC

## 2016-01-10 NOTE — Discharge Instructions (Signed)
Please follow up with [facility] gyn for scheduling ofyour surgery.  REturn for worsening, pain, bleeding, weakness

## 2016-01-10 NOTE — ED Provider Notes (Signed)
[facility] Emergency Department Provider Note    First MD Initiated Contact with Patient 01/10/16 1623     (approximate)  I have reviewed Kendra triage vital signs Kendra Kendra nursing notes.   HISTORY  Chief Complaint Abdominal Pain    HPI Kendra Higgins is a 35 y.o. female  with a history of uterine fibroids as well as heavy menstrual bleeding presents with diffuse abdominal pain that has been recurrent Kendra intermittent for several months. Patient states identical to previous episodes Kendra discomfort related to her uterine fibroids. Denies any diarrhea. No dysuria. Denies any chance of being pregnant. Is been evaluated at [place] for outpatient surgery but has not been to schedule this yet. She presents today due to persistent discomfort Kendra uncertainty in what medication she should take. Does have a history of chronic anemia Kendra was prescribed iron pills but has not been taking them. She's not been taking anything over-Kendra-counter to remedy pain.   Past Medical History:  Diagnosis Date  . Heavy menstrual bleeding   . Shortness of breath dyspnea   . Toothache    Family History  Problem Relation Age of Onset  . CVA Mother    Past Surgical History:  Procedure Laterality Date  . NO PAST SURGERIES     There are no active problems to display for this patient.     Prior to Admission medications   Medication Sig Start Date End Date Taking? Authorizing Provider  amoxicillin (AMOXIL) 500 MG capsule Take 1 capsule (500 mg total) by mouth 3 (three) times daily. 10/09/15   Sable Feil, PA-C  HYDROcodone-acetaminophen (NORCO) 5-325 MG tablet Take 1 tablet by mouth every 4 (four) hours as needed for moderate pain. 01/10/16   Merlyn Lot, MD  ibuprofen (ADVIL,MOTRIN) 200 MG tablet Take 800 mg by mouth every 6 (six) hours as needed.    Historical Provider, MD  ibuprofen (ADVIL,MOTRIN) 600 MG tablet Take 1 tablet (600 mg total) by mouth every 8 (eight)  hours as needed. 10/09/15   Sable Feil, PA-C  oxyCODONE-acetaminophen (ROXICET) 5-325 MG tablet Take 1 tablet by mouth every 4 (four) hours as needed for severe pain. 10/09/15   Sable Feil, PA-C  polyethylene glycol [facility] / GLYCOLAX) packet Take 17 g by mouth daily. Mix one tablespoon with 8oz of your favorite juice or water every day until you are having soft formed stools. Then start taking once daily if you didn't have a stool Kendra day before. 01/10/16   Merlyn Lot, MD  promethazine (PHENERGAN) 12.5 MG tablet Take 1 tablet (12.5 mg total) by mouth every 6 (six) hours as needed for nausea or vomiting. 01/10/16   Merlyn Lot, MD    Allergies Tramadol    Social History Social History  Substance Use Topics  . Smoking status: Current Every Day Smoker    Packs/day: 0.50    Years: 20.00    Types: Cigarettes  . Smokeless tobacco: Never Used  . Alcohol use No    Review of Systems Patient denies headaches, rhinorrhea, blurry vision, numbness, shortness of breath, chest pain, edema, cough, abdominal pain, nausea, vomiting, diarrhea, dysuria, fevers, rashes or hallucinations unless otherwise stated above in HPI. ____________________________________________   PHYSICAL EXAM:  VITAL SIGNS: Vitals:   01/10/16 1514 01/10/16 1730  BP: (!) 128/99 138/90  Pulse: 99 89  Resp: 18 18  Temp: 98.5 F (36.9 C)     Constitutional: Alert Kendra oriented. Well appearing Kendra in no  acute distress. Eyes: Conjunctivae are normal. PERRL. EOMI. Head: Atraumatic. Nose: No congestion/rhinnorhea. Mouth/Throat: Mucous membranes are moist.  Oropharynx non-erythematous. Neck: No stridor. Painless ROM. No cervical spine tenderness to palpation Hematological/Lymphatic/Immunilogical: No cervical lymphadenopathy. Cardiovascular: Normal rate, regular rhythm. Grossly normal heart sounds.  Good peripheral circulation. Respiratory: Normal respiratory effort.  No retractions. Lungs  CTAB. Gastrointestinal: distended, palpable uterine mass with mild tenderness. No peritoneal signs, rebound or guarding. No abdominal bruits. No CVA tenderness. Genitourinary:  Musculoskeletal: No lower extremity tenderness nor edema.  No joint effusions. Neurologic:  Normal speech Kendra language. No gross focal neurologic deficits are appreciated. No gait instability. Skin:  Skin is warm, dry Kendra intact. No rash noted. Psychiatric: Mood Kendra affect are normal. Speech Kendra behavior are normal.  ____________________________________________   LABS (all labs ordered are listed, but only abnormal results are displayed)  Results for orders placed or performed during Kendra hospital encounter of 01/10/16 (from Kendra past 24 hour(s))  Lipase, blood     Status: None   Collection Time: 01/10/16  3:16 PM  Result Value Ref Range   Lipase 23 11 - 51 U/L  Comprehensive metabolic panel     Status: Abnormal   Collection Time: 01/10/16  3:16 PM  Result Value Ref Range   Sodium 135 135 - 145 mmol/L   Potassium 4.1 3.5 - 5.1 mmol/L   Chloride 108 101 - 111 mmol/L   CO2 20 (L) 22 - 32 mmol/L   Glucose, Bld 107 (H) 65 - 99 mg/dL   BUN 15 6 - 20 mg/dL   Creatinine, Ser 0.78 0.44 - 1.00 mg/dL   Calcium 9.0 8.9 - 10.3 mg/dL   Total Protein 8.1 6.5 - 8.1 g/dL   Albumin 3.9 3.5 - 5.0 g/dL   AST 26 15 - 41 U/L   ALT 16 14 - 54 U/L   Alkaline Phosphatase 67 38 - 126 U/L   Total Bilirubin 1.0 0.3 - 1.2 mg/dL   GFR calc non Af Amer >60 >60 mL/min   GFR calc Af Amer >60 >60 mL/min   Anion gap 7 5 - 15  CBC     Status: Abnormal   Collection Time: 01/10/16  3:16 PM  Result Value Ref Range   WBC 10.9 3.6 - 11.0 K/uL   RBC 4.88 3.80 - 5.20 MIL/uL   Hemoglobin 9.6 (L) 12.0 - 16.0 g/dL   HCT 30.3 (L) 35.0 - 47.0 %   MCV 62.1 (L) 80.0 - 100.0 fL   MCH 19.6 (L) 26.0 - 34.0 pg   MCHC 31.6 (L) 32.0 - 36.0 g/dL   RDW 19.8 (H) 11.5 - 14.5 %   Platelets 311 150 - 440 K/uL  Urinalysis, Complete w Microscopic      Status: Abnormal   Collection Time: 01/10/16  3:16 PM  Result Value Ref Range   Color, Urine YELLOW (A) YELLOW   APPearance CLEAR (A) CLEAR   Specific Gravity, Urine 1.024 1.005 - 1.030   pH 5.0 5.0 - 8.0   Glucose, UA NEGATIVE NEGATIVE mg/dL   Hgb urine dipstick NEGATIVE NEGATIVE   Bilirubin Urine NEGATIVE NEGATIVE   Ketones, ur NEGATIVE NEGATIVE mg/dL   Protein, ur NEGATIVE NEGATIVE mg/dL   Nitrite NEGATIVE NEGATIVE   Leukocytes, UA NEGATIVE NEGATIVE   RBC / HPF 0-5 0 - 5 RBC/hpf   WBC, UA 0-5 0 - 5 WBC/hpf   Bacteria, UA NONE SEEN NONE SEEN   Squamous Epithelial / LPF 0-5 (A) NONE SEEN  Mucous PRESENT   Pregnancy, urine POC     Status: None   Collection Time: 01/10/16  4:01 PM  Result Value Ref Range   Preg Test, Ur NEGATIVE NEGATIVE   ____________________________________________  EKG____________________________________________  RADIOLOGY   ____________________________________________   PROCEDURES  Procedure(s) performed:  Procedures    Critical Care performed: no ____________________________________________   INITIAL IMPRESSION / ASSESSMENT Kendra PLAN / ED COURSE  Pertinent labs & imaging results that were available during my care of Kendra patient were reviewed by me Kendra considered in my medical decision making (see chart for details).  DDX: fibroid, ectopic, gastritis, obstruction, endometrioma  Adaia S Matsen is a 35 y.o. who presents to Kendra ED with recurrent generalized abdominal pain secondary to uterine fibroids. Patient afebrile hemodynamically stable. Her blood work is at baseline Kendra reassuring. She does not have any active uterine bleeding.  She is not pregnant. Denies any fevers. Did have some nausea Kendra vomiting secondary to pain that has resolved.  I recommended Kendra ultrasound imaging Kendra patient states that Kendra pain is identical to previous Kendra does not want further imaging studies at this time. Just wants to have her surgery.  On review of care  everywhere does appear that [facility] was planning to schedule outpatient surgery within Kendra next several weeks but has not received a call back from Kendra patient. After discussing this Kendra patient appears that there is still waiting on charity care forms to be complete. I encouraged patient to recall that office to see if they could expedite her paperwork.  Based on Kendra patient's well appearance with stable vitals Kendra reassuring laboratory workup without evidence of active vaginal bleeding I do not believe there are  indications for emergent surgery at this time.  as this has been ongoing for quite some time  I explained that I am happy to provide analgesia Kendra antiemetics. Patient agrees to this. Patient was able to tolerate PO Kendra was able to ambulate with a steady gait.  Have discussed with Kendra patient Kendra available family all diagnostics Kendra treatments performed thus far Kendra all questions were answered to Kendra best of my ability. Kendra patient demonstrates understanding Kendra agreement with plan.   Clinical Course     ____________________________________________   FINAL CLINICAL IMPRESSION(S) / ED DIAGNOSES  Final diagnoses:  Generalized abdominal pain  Uterine leiomyoma, unspecified location      NEW MEDICATIONS STARTED DURING THIS VISIT:  Discharge Medication List as of 01/10/2016  5:10 PM       Note:  This document was prepared using Dragon voice recognition software Kendra may include unintentional dictation errors.    Merlyn Lot, MD 01/10/16 2032

## 2016-01-10 NOTE — ED Triage Notes (Signed)
Pt reports she began this am having lower abd pain with nausea and vomiting. Pt reports hx of uterine fibroids and this feels similar.

## 2016-11-01 ENCOUNTER — Ambulatory Visit: Payer: Self-pay | Admitting: Obstetrics & Gynecology

## 2017-03-31 ENCOUNTER — Ambulatory Visit: Payer: Self-pay | Admitting: Obstetrics and Gynecology

## 2018-02-04 ENCOUNTER — Emergency Department
Admission: EM | Admit: 2018-02-04 | Discharge: 2018-02-04 | Disposition: A | Discharge status: Home / Self Care | Payer: Self-pay | Attending: Emergency Medicine | Admitting: Emergency Medicine

## 2018-02-04 ENCOUNTER — Encounter: Payer: Self-pay | Admitting: Emergency Medicine

## 2018-02-04 ENCOUNTER — Other Ambulatory Visit: Payer: Self-pay

## 2018-02-04 DIAGNOSIS — F17210 Nicotine dependence, cigarettes, uncomplicated: Secondary | ICD-10-CM | POA: Insufficient documentation

## 2018-02-04 DIAGNOSIS — K0889 Other specified disorders of teeth and supporting structures: Secondary | ICD-10-CM | POA: Insufficient documentation

## 2018-02-04 MED ORDER — AMOXICILLIN 500 MG PO CAPS: 500.0000 mg | ORAL_CAPSULE | Freq: Three times a day (TID) | ORAL | 0 refills | Status: DC

## 2018-02-04 MED ORDER — HYDROCODONE-ACETAMINOPHEN 5-325 MG PO TABS: 1.0000 | ORAL_TABLET | Freq: Four times a day (QID) | ORAL | 0 refills | Status: DC | PRN

## 2018-02-04 NOTE — ED Provider Notes (Signed)
[facility] Emergency Department Provider Note ____________________________________________  Time seen: Approximately 4:58 PM  I have reviewed the triage vital signs and the nursing notes.   HISTORY  Chief Complaint Dental Pain and Facial Pain   HPI Kendra Higgins is a 38 y.o. female who presents to the emergency department for treatment of dental pain that has been present for the past 3-4 days. She initially took some left over amoxicillin, but only had 2 tablets. The pain and swelling reduced, but she didn't have any more antibiotic and the pain intensified and swelling returned. No relief with ibuprofen, tylenol, BC Powder.  Past Medical History:  Diagnosis Date  . Heavy menstrual bleeding   . Shortness of breath dyspnea   . Toothache     There are no active problems to display for this patient.   Past Surgical History:  Procedure Laterality Date  . NO PAST SURGERIES      Prior to Admission medications   Medication Sig Start Date End Date Taking? Authorizing Provider  amoxicillin (AMOXIL) 500 MG capsule Take 1 capsule (500 mg total) by mouth 3 (three) times daily. 02/04/18   Jmari Pelc, Johnette Abraham B, FNP  HYDROcodone-acetaminophen (NORCO) 5-325 MG tablet Take 1 tablet by mouth every 6 (six) hours as needed for moderate pain. 02/04/18   Anushree Dorsi B, FNP  ibuprofen (ADVIL,MOTRIN) 200 MG tablet Take 800 mg by mouth every 6 (six) hours as needed.    [provider]  ibuprofen (ADVIL,MOTRIN) 600 MG tablet Take 1 tablet (600 mg total) by mouth every 8 (eight) hours as needed. 10/09/15   Sable Feil, PA-C  oxyCODONE-acetaminophen (ROXICET) 5-325 MG tablet Take 1 tablet by mouth every 4 (four) hours as needed for severe pain. 10/09/15   Sable Feil, PA-C  polyethylene glycol [facility] / GLYCOLAX) packet Take 17 g by mouth daily. Mix one tablespoon with 8oz of your favorite juice or water every day until you are having soft formed stools. Then start  taking once daily if you didn't have a stool the day before. 01/10/16   Merlyn Lot, MD  promethazine (PHENERGAN) 12.5 MG tablet Take 1 tablet (12.5 mg total) by mouth every 6 (six) hours as needed for nausea or vomiting. 01/10/16   Merlyn Lot, MD    Allergies Tramadol  Family History  Problem Relation Age of Onset  . CVA Mother     Social History Social History   Tobacco Use  . Smoking status: Current Every Day Smoker    Packs/day: 0.50    Years: 20.00    Pack years: 10.00    Types: Cigarettes  . Smokeless tobacco: Never Used  Substance Use Topics  . Alcohol use: No  . Drug use: Yes    Frequency: 7.0 times per week    Types: Marijuana    Review of Systems Constitutional: Negative for fever or recent illness. ENT: Positive for dental pain. Musculoskeletal: Negative for trismus of the jaw.  Skin: Negative for wound or lesion. ____________________________________________   PHYSICAL EXAM:  VITAL SIGNS: ED Triage Vitals  Enc Vitals Group     BP 02/04/18 1633 (!) 137/92     Pulse Rate 02/04/18 1633 94     Resp --      Temp 02/04/18 1633 97.7 F (36.5 C)     Temp Source 02/04/18 1633 Oral     SpO2 02/04/18 1633 100 %     Weight 02/04/18 1633 200 lb (90.7 kg)     Height  02/04/18 1633 5\' 4"  (1.626 m)     Head Circumference --      Peak Flow --      Pain Score 02/04/18 1643 10     Pain Loc --      Pain Edu? --      Excl. in [place]? --     Constitutional: Alert and oriented. Well appearing and in no acute distress. Eyes: Conjunctiva are clear without discharge or drainage. Mouth/Throat: Airway is patent. Periodontal Exam    Hematological/Lymphatic/Immunilogical: No palpable adenopathy. Respiratory: Respirations even and unlabored. Musculoskeletal: Full ROM of the jaw. Neurologic: Awake, alert, oriented.  Skin:  Mild facial edema overlying the left lower jaw. Psychiatric: Affect and behavior intact.  ____________________________________________    LABS (all labs ordered are listed, but only abnormal results are displayed)  Labs Reviewed - No data to display ____________________________________________   RADIOLOGY  Not indicated. ____________________________________________   PROCEDURES  Procedure(s) performed:   Procedures  Critical Care performed: No ____________________________________________   INITIAL IMPRESSION / ASSESSMENT AND PLAN / ED COURSE  Kendra Higgins is a 38 y.o. female who presents to the emergency department for treatment and evaluation of dental pain and facial swelling.  The area of swelling is consistent with the area of pain.  There is no indication of [place]'s angina.  Today, she will be treated with amoxicillin and given a very short supply of Norco.  She was instructed that she should follow-up with a dentist of her choice within the next 2 weeks and was provided a list of community resources.  She was advised to follow-up with primary care or return to the emergency department for symptoms of change or worsen if she is unable to schedule an appointment with the dentist.  Pertinent labs & imaging results that were available during my care of the patient were reviewed by me and considered in my medical decision making (see chart for details).  ____________________________________________   FINAL CLINICAL IMPRESSION(S) / ED DIAGNOSES  Final diagnoses:  Pain, dental    Current Discharge Medication List      If controlled substance prescribed during this visit, 12 month history viewed on the [place] prior to issuing an initial prescription for Schedule II or III opiod.  Note:  This document was prepared using Dragon voice recognition software and may include unintentional dictation errors.    Victorino Dike, FNP 02/04/18 1713    Schuyler Amor, MD 02/04/18 [phone]

## 2018-02-04 NOTE — Discharge Instructions (Signed)
Please call and schedule a dental appointment as soon as possible. You will need to be seen within the next 14 days. Return to the emergency department for symptoms that change or worsen if you're unable to schedule an appointment.  OPTIONS FOR DENTAL FOLLOW UP CARE  [facility] and [place] [email or web address] [phone])  [facility] [phone])  [place] [phone] ext [phone])  [place] [phone])  [facility] [phone]) This clinic caters to the indigent population and is on a lottery system. Location: [employer], [place], [place], [place] [facility] Hours: Wednesdays from 6pm - 9pm, patients seen by a lottery system. For dates, call or go to [email or web address] Services: Cleanings, fillings and simple extractions. Payment Options: DENTAL WORK IS FREE OF CHARGE. Bring proof of income or support. Best way to get seen: Arrive at 5:15 pm - this is a lottery, NOT first come/first serve, so arriving earlier will not increase your chances of being seen.     [facility] [phone] Select option 1 for emergencies   Location: [facility], [place], [address], [place] Clinic Hours: No walk-ins accepted - call the day before to schedule an appointment. Check in times are 9:30 am and 1:30 pm. Services: Simple extractions, temporary fillings, pulpectomy/pulp debridement, uncomplicated abscess drainage. Payment Options: PAYMENT IS DUE AT THE TIME OF SERVICE.  Fee is usually $100-200, additional surgical procedures (e.g. abscess drainage) may be extra. Cash, checks, Visa/MasterCard accepted.  Can file Medicaid if patient is covered for dental - patient should call case worker to check. No  discount for [facility] patients. Best way to get seen: MUST call the day before and get onto the schedule. Can usually be seen the next 1-2 days. No walk-ins accepted.     [place] [phone]   Location: [place], [place] [facility] Hours: M, W, Th, F 8am or 1:30pm, Tues 9a or 1:30 - first come/first served. Services: Simple extractions, temporary fillings, uncomplicated abscess drainage.  You do not need to be an [facility] resident. Payment Options: PAYMENT IS DUE AT THE TIME OF SERVICE. Dental insurance, otherwise sliding scale - bring proof of income or support. Depending on income and treatment needed, cost is usually $50-200. Best way to get seen: Arrive early as it is first come/first served.     [facility] [phone]   Location: [place] [facility] Hours: Mon-Thu 8a-5p Services: Most basic dental services including extractions and fillings. Payment Options: PAYMENT IS DUE AT THE TIME OF SERVICE. Sliding scale, up to 50% off - bring proof if income or support. Medicaid with dental option accepted. Best way to get seen: Call to schedule an appointment, can usually be seen within 2 weeks OR they will try to see walk-ins - show up at [place] or 2p (you may have to wait).     [facility] [place] RESIDENTS ONLY   Location: [facility], [place] [address] [place], [place] [address] Clinic Hours: By appointment only. Monday - Thursday 8am-5pm, Friday 8am-12pm Services: Cleanings, fillings, extractions. Payment Options: PAYMENT IS DUE AT THE TIME OF SERVICE. Cash, Visa or MasterCard. Sliding scale - $30 minimum per service. Best way to get seen: Come in to office, complete packet and make an appointment -  need proof of income or support monies for each household member and proof of [facility]  residence. Usually takes about a month to get in.     [facility] [phone]   Location: [address], [place] [facility] Hours: Walk-in Urgent Care Dental Services are offered Monday-Friday mornings only. The numbers of emergencies accepted daily is limited to the number of providers available. Maximum 15 - Mondays, Wednesdays & Thursdays Maximum 10 - Tuesdays & Fridays Services: You do not need to be a [facility] resident to be seen for a dental emergency. Emergencies are defined as pain, swelling, abnormal bleeding, or dental trauma. Walkins will receive x-rays if needed. NOTE: Dental cleaning is not an emergency. Payment Options: PAYMENT IS DUE AT THE TIME OF SERVICE. Minimum co-pay is $40.00 for uninsured patients. Minimum co-pay is $3.00 for Medicaid with dental coverage. Dental Insurance is accepted and must be presented at time of visit. Medicare does not cover dental. Forms of payment: Cash, credit card, checks. Best way to get seen: If not previously registered with the clinic, walk-in dental registration begins at 7:15 am and is on a first come/first serve basis. If previously registered with the clinic, call to make an appointment.     [facility] [place] ONLY   Location: [address] [place], [place] Clinic Hours: Mon-Thu 10a-2p Services: Extractions only! Payment Options: FREE (donations accepted) - bring proof of income or support Best way to get seen: Call and schedule an appointment OR come at 8am on the 1st Monday of every month (except for holidays) when it is first come/first served.     [facility] [phone]   Location: [place], [place] Clinic Hours: Friday mornings Services, Payment Options, Best way to get seen: Call for info

## 2018-02-04 NOTE — ED Triage Notes (Signed)
Pt reports has dental pain for past 2 days reports swelling increased and took some antibiotics she had left and swelling went down. Mild swelling noted to left side of face pt talks in complete sentences no distress noted

## 2018-03-05 ENCOUNTER — Other Ambulatory Visit: Payer: Self-pay

## 2018-03-05 ENCOUNTER — Encounter: Payer: Self-pay | Admitting: Intensive Care

## 2018-03-05 ENCOUNTER — Emergency Department
Admission: EM | Admit: 2018-03-05 | Discharge: 2018-03-05 | Disposition: A | Discharge status: Home / Self Care | Payer: Self-pay | Attending: Emergency Medicine | Admitting: Emergency Medicine

## 2018-03-05 DIAGNOSIS — K1120 Sialoadenitis, unspecified: Secondary | ICD-10-CM | POA: Insufficient documentation

## 2018-03-05 DIAGNOSIS — F17210 Nicotine dependence, cigarettes, uncomplicated: Secondary | ICD-10-CM | POA: Insufficient documentation

## 2018-03-05 HISTORY — DX: Meningitis, unspecified: G03.9

## 2018-03-05 MED ORDER — AMOXICILLIN-POT CLAVULANATE 875-125 MG PO TABS: 1.0000 | ORAL_TABLET | Freq: Two times a day (BID) | ORAL | 0 refills | Status: DC

## 2018-03-05 NOTE — ED Provider Notes (Signed)
[facility] Emergency Department Provider Note  ____________________________________________  Time seen: Approximately 6:00 PM  I have reviewed the triage vital signs and the nursing notes.   HISTORY  Chief Complaint Cyst (right side knot)    HPI Kendra Higgins is a 38 y.o. female presents the emergency department complaining of a "knot" to the right submandibular region.  Patient reports that this morning she woke up, ate something and felt a little "tingle" in this region.  Patient states that "I did not think anything of it" and then went to lunch.  Patient reports that she had a repeat at this sensation in the right submandibular region and her boyfriend noticed some swelling in this area.  Patient reports that when she palpated this region she felt a "knot" became concerned and presents emergency department.  No fevers or chills, difficulty breathing or swallowing, sore throat, nausea vomiting, diarrhea or constipation.  No medications for his complaint prior to arrival.  No history of same.  No recent illnesses.    Past Medical History:  Diagnosis Date  . Heavy menstrual bleeding   . Meningitis spinal   . Shortness of breath dyspnea   . Toothache     There are no active problems to display for this patient.   Past Surgical History:  Procedure Laterality Date  . NO PAST SURGERIES      Prior to Admission medications   Medication Sig Start Date End Date Taking? Authorizing Provider  amoxicillin (AMOXIL) 500 MG capsule Take 1 capsule (500 mg total) by mouth 3 (three) times daily. 02/04/18   Triplett, Johnette Abraham B, FNP  amoxicillin-clavulanate (AUGMENTIN) 875-125 MG tablet Take 1 tablet by mouth 2 (two) times daily. 03/05/18   Philomena Buttermore, Charline Bills, PA-C  HYDROcodone-acetaminophen (NORCO) 5-325 MG tablet Take 1 tablet by mouth every 6 (six) hours as needed for moderate pain. 02/04/18   Triplett, Cari B, FNP  ibuprofen (ADVIL,MOTRIN) 200 MG tablet Take 800 mg by  mouth every 6 (six) hours as needed.    [provider]  ibuprofen (ADVIL,MOTRIN) 600 MG tablet Take 1 tablet (600 mg total) by mouth every 8 (eight) hours as needed. 10/09/15   Sable Feil, PA-C  oxyCODONE-acetaminophen (ROXICET) 5-325 MG tablet Take 1 tablet by mouth every 4 (four) hours as needed for severe pain. 10/09/15   Sable Feil, PA-C  polyethylene glycol [facility] / GLYCOLAX) packet Take 17 g by mouth daily. Mix one tablespoon with 8oz of your favorite juice or water every day until you are having soft formed stools. Then start taking once daily if you didn't have a stool the day before. 01/10/16   Merlyn Lot, MD  promethazine (PHENERGAN) 12.5 MG tablet Take 1 tablet (12.5 mg total) by mouth every 6 (six) hours as needed for nausea or vomiting. 01/10/16   Merlyn Lot, MD    Allergies Tramadol  Family History  Problem Relation Age of Onset  . CVA Mother     Social History Social History   Tobacco Use  . Smoking status: Current Every Day Smoker    Packs/day: 0.50    Years: 20.00    Pack years: 10.00    Types: Cigarettes  . Smokeless tobacco: Never Used  Substance Use Topics  . Alcohol use: Yes  . Drug use: Yes    Frequency: 7.0 times per week    Types: Marijuana    Comment: ecstasy     Review of Systems  Constitutional: No fever/chills Eyes: No visual  changes. No discharge ENT: Positive for "tingling" sensation to the submandibular region with eating as well as a palpable lesion to the submandibular region Cardiovascular: no chest pain. Respiratory: no cough. No SOB. Gastrointestinal: No abdominal pain.  No nausea, no vomiting.  No diarrhea.  No constipation. Musculoskeletal: Negative for musculoskeletal pain. Skin: Negative for rash, abrasions, lacerations, ecchymosis. Neurological: Negative for headaches, focal weakness or numbness. 10-point ROS otherwise negative.  ____________________________________________   PHYSICAL  EXAM:  VITAL SIGNS: ED Triage Vitals  Enc Vitals Group     BP 03/05/18 1728 129/84     Pulse Rate 03/05/18 1728 95     Resp 03/05/18 1728 16     Temp 03/05/18 1728 98.2 F (36.8 C)     Temp Source 03/05/18 1728 Oral     SpO2 03/05/18 1728 98 %     Weight 03/05/18 1729 200 lb (90.7 kg)     Height 03/05/18 1729 5\' 4"  (1.626 m)     Head Circumference --      Peak Flow --      Pain Score 03/05/18 1729 0     Pain Loc --      Pain Edu? --      Excl. in [place]? --      Constitutional: Alert and oriented. Well appearing and in no acute distress. Eyes: Conjunctivae are normal. PERRL. EOMI. Head: Atraumatic. ENT:      Ears:       Nose: No congestion/rhinnorhea.      Mouth/Throat: Mucous membranes are moist.  No intraoral edema or erythema concerning for infection.  Palpation in the submandibular region reveals solitary lesion in the right submandibular region consistent with sialadenitis.  No tenderness to palpation in this region.  No fluctuance or induration.  No overlying skin changes. Neck: No stridor.   Hematological/Lymphatic/Immunilogical: No cervical lymphadenopathy. Cardiovascular: Normal rate, regular rhythm. Normal S1 and S2.  Good peripheral circulation. Respiratory: Normal respiratory effort without tachypnea or retractions. Lungs CTAB. Good air entry to the bases with no decreased or absent breath sounds. Musculoskeletal: Full range of motion to all extremities. No gross deformities appreciated. Neurologic:  Normal speech and language. No gross focal neurologic deficits are appreciated.  Skin:  Skin is warm, dry and intact. No rash noted. Psychiatric: Mood and affect are normal. Speech and behavior are normal. Patient exhibits appropriate insight and judgement.   ____________________________________________   LABS (all labs ordered are listed, but only abnormal results are displayed)  Labs Reviewed - No data to  display ____________________________________________  EKG   ____________________________________________  RADIOLOGY   No results found.  ____________________________________________    PROCEDURES  Procedure(s) performed:    Procedures    Medications - No data to display   ____________________________________________   INITIAL IMPRESSION / ASSESSMENT AND PLAN / ED COURSE  Pertinent labs & imaging results that were available during my care of the patient were reviewed by me and considered in my medical decision making (see chart for details).  Review of the [place] CSRS was performed in accordance of the [place] prior to dispensing any controlled drugs.      Patient's diagnosis is consistent with sialadenitis.  Patient presents emergency department with a complaint of tingling sensation with eating as well as a "knot" in the right submandibular region.  Evaluation is consistent with sial adenitis.  Patient is encouraged to use sour candy and I will prescribe Augmentin for same.  Follow-up with primary care as needed.  No indication for labs or  imaging at this time..  Patient is given ED precautions to return to the ED for any worsening or new symptoms.     ____________________________________________  FINAL CLINICAL IMPRESSION(S) / ED DIAGNOSES  Final diagnoses:  Sialadenitis      NEW MEDICATIONS STARTED DURING THIS VISIT:  ED Discharge Orders         Ordered    amoxicillin-clavulanate (AUGMENTIN) 875-125 MG tablet  2 times daily     03/05/18 1759              This chart was dictated using voice recognition software/Dragon. Despite best efforts to proofread, errors can occur which can change the meaning. Any change was purely unintentional.    Darletta Moll, PA-C 03/05/18 1802    Nance Pear, MD 03/05/18 [phone]

## 2018-03-05 NOTE — ED Notes (Signed)
PA at bedside.

## 2018-03-05 NOTE — ED Triage Notes (Addendum)
Patient reports a knot present on Right side of neck. Denies pain. Patient reports taking a ecstasy pill on Friday 2/14 and sometimes her face swells due to the drug. She is unsure if this knot is related or not

## 2018-06-27 ENCOUNTER — Encounter: Payer: Self-pay | Admitting: Obstetrics and Gynecology

## 2018-06-27 ENCOUNTER — Ambulatory Visit: Payer: Self-pay | Admitting: Obstetrics and Gynecology

## 2018-06-27 ENCOUNTER — Other Ambulatory Visit: Payer: Self-pay

## 2018-06-27 VITALS — BP 124/78 | Ht 63.0 in | Wt 205.0 lb

## 2018-06-27 DIAGNOSIS — N979 Female infertility, unspecified: Secondary | ICD-10-CM

## 2018-06-27 DIAGNOSIS — Z1339 Encounter for screening examination for other mental health and behavioral disorders: Secondary | ICD-10-CM

## 2018-06-27 DIAGNOSIS — Z01419 Encounter for gynecological examination (general) (routine) without abnormal findings: Secondary | ICD-10-CM

## 2018-06-27 DIAGNOSIS — Z1331 Encounter for screening for depression: Secondary | ICD-10-CM

## 2018-06-27 DIAGNOSIS — E049 Nontoxic goiter, unspecified: Secondary | ICD-10-CM

## 2018-06-27 NOTE — Progress Notes (Signed)
Gynecology Annual Exam  PCP: Patient, No Pcp Per  Chief Complaint  Patient presents with  . Annual Exam   History of Present Illness:  Ms. Kendra Higgins is a 38 y.o. G2P0020 who LMP was Patient's last menstrual period was 06/11/2018., presents today for her annual examination.  Her menses are regular every 28-30 days, lasting 4 day(s).  Dysmenorrhea mild, occurring first 1-2 days of flow. She does not have intermenstrual bleeding.  She is sexually active.  Not using contraception..  Last Pap: 2017  Results were: no abnormalities /neg HPV DNA not done Hx of STDs: none  Last mammogram: n/a There is no FH of breast cancer. There is no FH of ovarian cancer. The patient does do self-breast exams.  Tobacco use: smokes 1/4 ppd, she used to smoke 2-3 ppd (100s). She changed this about 2 weeks ago. Alcohol use: social drinker Exercise: at work, Educational psychologist at [employer]  The patient wears seatbelts: yes.      She is status post uterine fibroid abdominal myomectomy 02/2016 due to a severely enlarged fibroid uterus.  She has been attempting pregnancy since about 1 year after her myomectomy. She had 11 fibroids removed at the time of her surgery.  Her significant other had some chest issues recently and is due to have a stress test. He is otherwise health. He has an 71 year-old son from another partner.  He takes no medications.  He is is 38 years old.    Past Medical History:  Diagnosis Date  . Heavy menstrual bleeding   . Meningitis spinal   . Shortness of breath dyspnea   . Toothache     Past Surgical History:  Procedure Laterality Date  . UTERINE FIBROID SURGERY     Prior to Admission medications: denies    Allergies  Allergen Reactions  . Tramadol Rash    Gynecologic History: Patient's last menstrual period was 06/11/2018.  Obstetric History: G2P0020, s/p SAB x 2. Most recent was 20 years ago.   Social History   Socioeconomic History  . Marital status: Single    Spouse  name: Not on file  . Number of children: Not on file  . Years of education: Not on file  . Highest education level: Not on file  Occupational History  . Not on file  Social Needs  . Financial resource strain: Not on file  . Food insecurity:    Worry: Not on file    Inability: Not on file  . Transportation needs:    Medical: Not on file    Non-medical: Not on file  Tobacco Use  . Smoking status: Current Every Day Smoker    Packs/day: 0.50    Years: 20.00    Pack years: 10.00    Types: Cigarettes  . Smokeless tobacco: Never Used  Substance and Sexual Activity  . Alcohol use: Yes  . Drug use: Yes    Frequency: 7.0 times per week    Types: Marijuana    Comment: ecstasy  . Sexual activity: Yes    Birth control/protection: None  Lifestyle  . Physical activity:    Days per week: Not on file    Minutes per session: Not on file  . Stress: Not on file  Relationships  . Social connections:    Talks on phone: Not on file    Gets together: Not on file    Attends religious service: Not on file    Active member of club or organization: Not on  file    Attends meetings of clubs or organizations: Not on file    Relationship status: Not on file  . Intimate partner violence:    Fear of current or ex partner: Not on file    Emotionally abused: Not on file    Physically abused: Not on file    Forced sexual activity: Not on file  Other Topics Concern  . Not on file  Social History Narrative  . Not on file    Family History  Problem Relation Age of Onset  . CVA Mother     Review of Systems  Constitutional: Negative.   HENT: Negative.   Eyes: Negative.   Respiratory: Negative.   Cardiovascular: Negative.   Gastrointestinal: Negative.   Genitourinary: Negative.   Musculoskeletal: Negative.   Skin: Negative.   Neurological: Negative.   Psychiatric/Behavioral: Negative.      Physical Exam BP 124/78   Ht 5\' 3"  (1.6 m)   Wt 205 lb (93 kg)   LMP 06/11/2018   BMI 36.31  kg/m    Physical Exam Constitutional:      General: She is not in acute distress.    Appearance: Normal appearance. She is well-developed.  Genitourinary:     Genitourinary Comments: Pelvic exam declined by patient  HENT:     Head: Normocephalic and atraumatic.  Eyes:     General: No scleral icterus.    Conjunctiva/sclera: Conjunctivae normal.  Neck:     Musculoskeletal: Normal range of motion and neck supple.     Thyroid: Thyromegaly (bilateral, palpates symmetrical) present.  Cardiovascular:     Rate and Rhythm: Normal rate and regular rhythm.     Heart sounds: No murmur. No friction rub. No gallop.   Pulmonary:     Effort: Pulmonary effort is normal. No respiratory distress.     Breath sounds: Wheezing (bilateral expiratory, mild) present. No rhonchi or rales.  Abdominal:     General: Bowel sounds are normal. There is no distension.     Palpations: Abdomen is soft. There is mass (suprapubic?  Exam limited by patient tolerability).     Tenderness: There is no abdominal tenderness. There is no guarding or rebound.  Musculoskeletal: Normal range of motion.  Neurological:     General: No focal deficit present.     Mental Status: She is alert and oriented to person, place, and time.     Cranial Nerves: No cranial nerve deficit.  Skin:    General: Skin is warm and dry.     Findings: No erythema.  Psychiatric:        Mood and Affect: Mood normal.        Behavior: Behavior normal.        Judgment: Judgment normal.  Vitals signs reviewed. Chaperone present: Declined by patient.     Female chaperone present for pelvic and breast  portions of the physical exam  Results: AUDIT Questionnaire (screen for alcoholism): 1 PHQ-9: 0   Assessment: 38 y.o. G77P0020 female here for routine annual gynecologic examination.  Plan: Problem List Items Addressed This Visit    None    Visit Diagnoses    Women's annual routine gynecological examination    -  Primary   Relevant Orders    TSH + free T4   Screening for depression       Screening for alcoholism       Enlarged thyroid       Relevant Orders   TSH + free T4   Infertility,  female       Relevant Orders   US PELVIS TRANSVANGINAL NON-OB (TV ONLY)      Screening: -- Blood pressure screen normal -- Colonoscopy - not due -- Mammogram - not due -- Weight screening: obese: discussed management options, including lifestyle, dietary, and exercise. -- Depression screening negative (PHQ-9) -- Nutrition: normal -- cholesterol screening: not due for screening -- osteoporosis screening: not due -- tobacco screening: recently decreased amount and planning to quit -- alcohol screening: AUDIT questionnaire indicates low-risk usage. -- family history of breast cancer screening: done. not at high risk. -- no evidence of domestic violence or intimate partner violence. -- STD screening: gonorrhea/chlamydia NAAT not collected per patient request. -- pap smear declined by patient per ASCCP guidelines -- flu vaccine received this past year -- HPV vaccination series: has not received   Patient declined breast and pelvic exams.    Prentice Docker, MD 06/27/2018 5:03 PM

## 2018-07-04 ENCOUNTER — Ambulatory Visit: Payer: Self-pay

## 2018-10-31 ENCOUNTER — Other Ambulatory Visit: Payer: Self-pay

## 2018-10-31 ENCOUNTER — Emergency Department
Admission: EM | Admit: 2018-10-31 | Discharge: 2018-10-31 | Disposition: A | Discharge status: Home / Self Care | Payer: Self-pay | Attending: Emergency Medicine | Admitting: Emergency Medicine

## 2018-10-31 ENCOUNTER — Encounter: Payer: Self-pay | Admitting: Emergency Medicine

## 2018-10-31 DIAGNOSIS — F17210 Nicotine dependence, cigarettes, uncomplicated: Secondary | ICD-10-CM | POA: Insufficient documentation

## 2018-10-31 DIAGNOSIS — K029 Dental caries, unspecified: Secondary | ICD-10-CM | POA: Insufficient documentation

## 2018-10-31 MED ORDER — OXYCODONE-ACETAMINOPHEN 5-325 MG PO TABS
1.0000 | ORAL_TABLET | Freq: Once | ORAL | Status: AC
  Administered 2018-10-31: 05:00:00 1 via ORAL
  Filled 2018-10-31: qty 1

## 2018-10-31 MED ORDER — LIDOCAINE VISCOUS HCL 2 % MT SOLN
15.0000 mL | Freq: Once | OROMUCOSAL | Status: AC
  Administered 2018-10-31: 15 mL via OROMUCOSAL
  Filled 2018-10-31: qty 15

## 2018-10-31 MED ORDER — OXYCODONE-ACETAMINOPHEN 5-325 MG PO TABS: 1.0000 | ORAL_TABLET | ORAL | 0 refills | Status: AC | PRN

## 2018-10-31 MED ORDER — IBUPROFEN 800 MG PO TABS: 800.0000 mg | ORAL_TABLET | Freq: Three times a day (TID) | ORAL | 0 refills | Status: AC | PRN

## 2018-10-31 MED ORDER — AMOXICILLIN 500 MG PO CAPS: 500.0000 mg | ORAL_CAPSULE | Freq: Three times a day (TID) | ORAL | 0 refills | Status: AC

## 2018-10-31 MED ORDER — IBUPROFEN 800 MG PO TABS
800.0000 mg | ORAL_TABLET | Freq: Once | ORAL | Status: AC
  Administered 2018-10-31: 05:00:00 800 mg via ORAL
  Filled 2018-10-31: qty 1

## 2018-10-31 MED ORDER — AMOXICILLIN 500 MG PO CAPS
500.0000 mg | ORAL_CAPSULE | Freq: Once | ORAL | Status: AC
  Administered 2018-10-31: 500 mg via ORAL
  Filled 2018-10-31: qty 1

## 2018-10-31 NOTE — ED Notes (Signed)
Patient discharged to home per MD order. Patient in stable condition, and deemed medically cleared by ED provider for discharge. Discharge instructions reviewed with patient/family using "Teach Back"; verbalized understanding of medication education and administration, and information about follow-up care. Denies further concerns. ° °

## 2018-10-31 NOTE — ED Provider Notes (Signed)
[facility] Emergency Department Provider Note   ____________________________________________   First MD Initiated Contact with Patient 10/31/18 [phone]     (approximate)  I have reviewed the triage vital signs and the nursing notes.   HISTORY  Chief Complaint Dental Pain    HPI Kendra Higgins is a 38 y.o. female who presents to the ED from home with a chief complaint of toothache.  Patient has a previously cracked right upper molar which awoke her from sleep 3 hours ago with pain.  Denies fever, swelling, nausea or vomiting.       Past Medical History:  Diagnosis Date  . Heavy menstrual bleeding   . Meningitis spinal   . Shortness of breath dyspnea   . Toothache     There are no active problems to display for this patient.   Past Surgical History:  Procedure Laterality Date  . UTERINE FIBROID SURGERY      Prior to Admission medications   Medication Sig Start Date End Date Taking? Authorizing Provider  amoxicillin (AMOXIL) 500 MG capsule Take 1 capsule (500 mg total) by mouth 3 (three) times daily. 10/31/18   Paulette Blanch, MD  ibuprofen (ADVIL) 800 MG tablet Take 1 tablet (800 mg total) by mouth every 8 (eight) hours as needed for moderate pain. 10/31/18   Paulette Blanch, MD  oxyCODONE-acetaminophen (PERCOCET/ROXICET) 5-325 MG tablet Take 1 tablet by mouth every 4 (four) hours as needed for severe pain. 10/31/18   Paulette Blanch, MD    Allergies Tramadol  Family History  Problem Relation Age of Onset  . CVA Mother     Social History Social History   Tobacco Use  . Smoking status: Current Every Day Smoker    Packs/day: 0.50    Years: 20.00    Pack years: 10.00    Types: Cigarettes  . Smokeless tobacco: Never Used  Substance Use Topics  . Alcohol use: Yes  . Drug use: Yes    Frequency: 7.0 times per week    Types: Marijuana    Comment: ecstasy    Review of Systems  Constitutional: No fever/chills Eyes: No visual changes. ENT:  Positive for dentalgia.  No sore throat. Cardiovascular: Denies chest pain. Respiratory: Denies shortness of breath. Gastrointestinal: No abdominal pain.  No nausea, no vomiting.  No diarrhea.  No constipation. Genitourinary: Negative for dysuria. Musculoskeletal: Negative for back pain. Skin: Negative for rash. Neurological: Negative for headaches, focal weakness or numbness.   ____________________________________________   PHYSICAL EXAM:  VITAL SIGNS: ED Triage Vitals  Enc Vitals Group     BP 10/31/18 0414 (!) 146/89     Pulse Rate 10/31/18 0414 72     Resp 10/31/18 0414 18     Temp 10/31/18 0414 98.8 F (37.1 C)     Temp Source 10/31/18 0414 Oral     SpO2 10/31/18 0414 99 %     Weight 10/31/18 0412 202 lb (91.6 kg)     Height 10/31/18 0412 5\' 4"  (1.626 m)     Head Circumference --      Peak Flow --      Pain Score 10/31/18 0412 10     Pain Loc --      Pain Edu? --      Excl. in [place]? --     Constitutional: Alert and oriented. Well appearing and in mild acute distress. Eyes: Conjunctivae are normal. PERRL. EOMI. Head: Atraumatic. Nose: No congestion/rhinnorhea. Mouth/Throat: Mucous membranes are moist.  Widespread dental decay.  Right upper molar has pre-existing cracked tooth down to the gum.  No surrounding abscess.  No intraoral or extraoral swelling. Neck: No stridor.   Cardiovascular: Normal rate, regular rhythm. Grossly normal heart sounds.  Good peripheral circulation. Respiratory: Normal respiratory effort.  No retractions. Lungs CTAB. Gastrointestinal: Soft and nontender. No distention. No abdominal bruits. No CVA tenderness. Musculoskeletal: No lower extremity tenderness nor edema.  No joint effusions. Neurologic:  Normal speech and language. No gross focal neurologic deficits are appreciated. No gait instability. Skin:  Skin is warm, dry and intact. No rash noted. Psychiatric: Mood and affect are normal. Speech and behavior are normal.   ____________________________________________   LABS (all labs ordered are listed, but only abnormal results are displayed)  Labs Reviewed - No data to display ____________________________________________  EKG  None ____________________________________________  RADIOLOGY  ED MD interpretation: None  Official radiology report(s): No results found.  ____________________________________________   PROCEDURES  Procedure(s) performed (including Critical Care):  Procedures   ____________________________________________   INITIAL IMPRESSION / ASSESSMENT AND PLAN / ED COURSE  As part of my medical decision making, I reviewed the following data within the [place] History obtained from family, Nursing notes reviewed and incorporated, Notes from prior ED visits and [place] Controlled Substance Maskell was evaluated in Emergency Department on 10/31/2018 for the symptoms described in the history of present illness. She was evaluated in the context of the global COVID-19 pandemic, which necessitated consideration that the patient might be at risk for infection with the SARS-CoV-2 virus that causes COVID-19. Institutional protocols and algorithms that pertain to the evaluation of patients at risk for COVID-19 are in a state of rapid change based on information released by regulatory bodies including the CDC and federal and state organizations. These policies and algorithms were followed during the patient's care in the ED.    38 year old female who presents to the ED for dentalgia stemming from widespread dental decay as well as pre-existing cracked molar.  Will place on amoxicillin, NSAIDs, analgesia.  Dental clinic referral sheet provided to patient.  Strict return precautions given.  Patient and spouse verbalized understanding and agree with plan of care.      ____________________________________________   FINAL CLINICAL IMPRESSION(S) / ED  DIAGNOSES  Final diagnoses:  Pain due to dental caries     ED Discharge Orders         Ordered    amoxicillin (AMOXIL) 500 MG capsule  3 times daily     10/31/18 0431    ibuprofen (ADVIL) 800 MG tablet  Every 8 hours PRN     10/31/18 0431    oxyCODONE-acetaminophen (PERCOCET/ROXICET) 5-325 MG tablet  Every 4 hours PRN     10/31/18 0431           Note:  This document was prepared using Dragon voice recognition software and may include unintentional dictation errors.   Paulette Blanch, MD 10/31/18 [phone]

## 2018-10-31 NOTE — ED Triage Notes (Signed)
Patient ambulatory to triage with steady gait, without difficulty or distress noted, mask in place; pt reports rt sided dental pain since yesterday

## 2018-10-31 NOTE — Discharge Instructions (Addendum)
1.  Take antibiotic as prescribed (Amoxicillin 500 mg 3 times daily x7 days). 2.  You may take pain medicines as needed (Motrin/Percocet #15). 3.  Return to the ER for worsening symptoms, persistent vomiting, difficulty breathing or other concerns.

## 2018-11-25 ENCOUNTER — Other Ambulatory Visit: Payer: Self-pay

## 2018-11-25 ENCOUNTER — Emergency Department
Admission: EM | Admit: 2018-11-25 | Discharge: 2018-11-25 | Disposition: A | Discharge status: Home / Self Care | Payer: Self-pay | Attending: Emergency Medicine | Admitting: Emergency Medicine

## 2018-11-25 DIAGNOSIS — K0889 Other specified disorders of teeth and supporting structures: Secondary | ICD-10-CM | POA: Insufficient documentation

## 2018-11-25 DIAGNOSIS — Z5321 Procedure and treatment not carried out due to patient leaving prior to being seen by health care provider: Secondary | ICD-10-CM | POA: Insufficient documentation

## 2018-11-25 NOTE — ED Triage Notes (Signed)
Pt to the er for upper left side dental pain. Pt reports she has a dentist appt at the end of the week.

## 2019-03-15 ENCOUNTER — Emergency Department
Admission: EM | Admit: 2019-03-15 | Discharge: 2019-03-16 | Disposition: A | Discharge status: Home / Self Care | Payer: Self-pay | Attending: Emergency Medicine | Admitting: Emergency Medicine

## 2019-03-15 ENCOUNTER — Other Ambulatory Visit: Payer: Self-pay

## 2019-03-15 DIAGNOSIS — F17210 Nicotine dependence, cigarettes, uncomplicated: Secondary | ICD-10-CM | POA: Insufficient documentation

## 2019-03-15 DIAGNOSIS — G44209 Tension-type headache, unspecified, not intractable: Secondary | ICD-10-CM | POA: Insufficient documentation

## 2019-03-15 DIAGNOSIS — Z79899 Other long term (current) drug therapy: Secondary | ICD-10-CM | POA: Insufficient documentation

## 2019-03-15 NOTE — ED Notes (Signed)
This Rn to the bedside. Pt is speaking in a soft voice making it difficult to hear. Pt states she does not feel comfortable in the hallway. Told pt all the rooms were full but we didn't want her to wait so we made a bed in the hall. Pt states she has been here for 3 hours and she didn't want to be in the hall. Asked pt if she would feel better going back to the lobby and when a room opened up we could bring her back. Advised pt this Rn would support her decision. Pt states she will stay in the hall to get treated. Dr Alfred Levins to the bedside. Pt reports headache x 1 week and then states 1 month. 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 Pt denies any blurry vision, fevers, nausea, vomiting.

## 2019-03-15 NOTE — ED Triage Notes (Signed)
Pt in with co headache for a month states last time she was seen for the same they told her it was caffeine related. Pt states does have light sensitivity.

## 2019-03-16 ENCOUNTER — Emergency Department: Payer: Self-pay

## 2019-03-16 LAB — CBC WITH DIFFERENTIAL/PLATELET
Abs Immature Granulocytes: 0.02 10*3/uL (ref 0.00–0.07)
Basophils Absolute: 0 10*3/uL (ref 0.0–0.1)
Basophils Relative: 1 %
Eosinophils Absolute: 0.1 10*3/uL (ref 0.0–0.5)
Eosinophils Relative: 1 %
HCT: 41 % (ref 36.0–46.0)
Hemoglobin: 13.1 g/dL (ref 12.0–15.0)
Immature Granulocytes: 0 %
Lymphocytes Relative: 39 %
Lymphs Abs: 2.7 10*3/uL (ref 0.7–4.0)
MCH: 27.6 pg (ref 26.0–34.0)
MCHC: 32 g/dL (ref 30.0–36.0)
MCV: 86.3 fL (ref 80.0–100.0)
Monocytes Absolute: 0.5 10*3/uL (ref 0.1–1.0)
Monocytes Relative: 8 %
Neutro Abs: 3.6 10*3/uL (ref 1.7–7.7)
Neutrophils Relative %: 51 %
Platelets: 331 10*3/uL (ref 150–400)
RBC: 4.75 MIL/uL (ref 3.87–5.11)
RDW: 13.6 % (ref 11.5–15.5)
WBC: 6.9 10*3/uL (ref 4.0–10.5)
nRBC: 0 % (ref 0.0–0.2)

## 2019-03-16 LAB — URINALYSIS, COMPLETE (UACMP) WITH MICROSCOPIC
Bilirubin Urine: NEGATIVE
Glucose, UA: NEGATIVE mg/dL
Hgb urine dipstick: NEGATIVE
Ketones, ur: NEGATIVE mg/dL
Nitrite: NEGATIVE
Protein, ur: NEGATIVE mg/dL
Specific Gravity, Urine: 1.018 (ref 1.005–1.030)
pH: 7 (ref 5.0–8.0)

## 2019-03-16 LAB — BASIC METABOLIC PANEL
Anion gap: 8 (ref 5–15)
BUN: 13 mg/dL (ref 6–20)
CO2: 28 mmol/L (ref 22–32)
Calcium: 9.6 mg/dL (ref 8.9–10.3)
Chloride: 105 mmol/L (ref 98–111)
Creatinine, Ser: 0.93 mg/dL (ref 0.44–1.00)
GFR calc Af Amer: >60 mL/min (ref >60)
GFR calc non Af Amer: >60 mL/min (ref >60)
Glucose, Bld: 88 mg/dL (ref 70–99)
Potassium: 4.2 mmol/L (ref 3.5–5.1)
Sodium: 141 mmol/L (ref 135–145)

## 2019-03-16 LAB — URINE CULTURE

## 2019-03-16 LAB — URINE DRUG SCREEN, QUALITATIVE (ARMC ONLY)
Amphetamines, Ur Screen: NOT DETECTED
Barbiturates, Ur Screen: NOT DETECTED
Benzodiazepine, Ur Scrn: NOT DETECTED
Cannabinoid 50 Ng, Ur ~~LOC~~: POSITIVE — AB
Cocaine Metabolite,Ur ~~LOC~~: NOT DETECTED
MDMA (Ecstasy)Ur Screen: NOT DETECTED
Methadone Scn, Ur: NOT DETECTED
Opiate, Ur Screen: NOT DETECTED
Phencyclidine (PCP) Ur S: NOT DETECTED
Tricyclic, Ur Screen: NOT DETECTED

## 2019-03-16 LAB — POCT PREGNANCY, URINE: Preg Test, Ur: NEGATIVE

## 2019-03-16 MED ORDER — CYCLOBENZAPRINE HCL 10 MG PO TABS
10.0000 mg | ORAL_TABLET | Freq: Once | ORAL | Status: AC
  Administered 2019-03-16: 10 mg via ORAL
  Filled 2019-03-16: qty 1

## 2019-03-16 MED ORDER — CYCLOBENZAPRINE HCL 10 MG PO TABS: 10.0000 mg | ORAL_TABLET | Freq: Three times a day (TID) | ORAL | 0 refills | Status: AC | PRN

## 2019-03-16 MED ORDER — BUTALBITAL-APAP-CAFFEINE 50-325-40 MG PO TABS: 1.0000 | ORAL_TABLET | Freq: Four times a day (QID) | ORAL | 0 refills | Status: AC | PRN

## 2019-03-16 MED ORDER — KETOROLAC TROMETHAMINE 30 MG/ML IJ SOLN
15.0000 mg | Freq: Once | INTRAMUSCULAR | Status: AC
  Administered 2019-03-16: 15 mg via INTRAVENOUS
  Filled 2019-03-16: qty 1

## 2019-03-16 MED ORDER — METOCLOPRAMIDE HCL 5 MG/ML IJ SOLN
10.0000 mg | Freq: Once | INTRAMUSCULAR | Status: AC
  Administered 2019-03-16: 01:00:00 10 mg via INTRAVENOUS
  Filled 2019-03-16: qty 2

## 2019-03-16 MED ORDER — DIPHENHYDRAMINE HCL 50 MG/ML IJ SOLN
25.0000 mg | Freq: Once | INTRAMUSCULAR | Status: AC
  Administered 2019-03-16: 25 mg via INTRAVENOUS
  Filled 2019-03-16: qty 1

## 2019-03-16 MED ORDER — SODIUM CHLORIDE 0.9 % IV BOLUS
1000.0000 mL | Freq: Once | INTRAVENOUS | Status: AC
  Administered 2019-03-16: 01:00:00 1000 mL via INTRAVENOUS

## 2019-03-16 NOTE — Discharge Instructions (Addendum)
You have been seen in the Emergency Department (ED) for a headache. Your evaluation today was overall reassuring. Headaches have many possible causes. Most headaches aren't a sign of a more serious problem, and they will get better on their own.   Follow-up with your doctor in 12-24 hours if you are still having a headache. Otherwise follow up with your doctor in 3-5 days.  For pain take Fioricet and Flexeril as prescribed  When should you call for help?  Call 911 or return to the ED anytime you think you may need emergency care. For example, call if:  You have signs of a stroke. These may include:  Sudden numbness, paralysis, or weakness in your face, arm, or leg, especially on only one side of your body.  Sudden vision changes.  Sudden trouble speaking.  Sudden confusion or trouble understanding simple statements.  Sudden problems with walking or balance.  A sudden, severe headache that is different from past headaches. You have new or worsening headache Nausea and vomiting associated with your headache Fever, neck stiffness associated with your headache  Call your doctor now or seek immediate medical care if:  You have a new or worse headache.  Your headache gets much worse.  How can you care for yourself at home?  Do not drive if you have taken a prescription pain medicine.  Rest in a quiet, dark room until your headache is gone. Close your eyes and try to relax or go to sleep. Don't watch TV or read.  Put a cold, moist cloth or cold pack on the painful area for 10 to 20 minutes at a time. Put a thin cloth between the cold pack and your skin.  Use a warm, moist towel or a heating pad set on low to relax tight shoulder and neck muscles.  Have someone gently massage your neck and shoulders.  Take pain medicines exactly as directed.  If the doctor gave you a prescription medicine for pain, take it as prescribed.  If you are not taking a prescription pain medicine, ask your doctor if  you can take an over-the-counter medicine. Be careful not to take pain medicine more often than the instructions allow, because you may get worse or more frequent headaches when the medicine wears off.  Do not ignore new symptoms that occur with a headache, such as a fever, weakness or numbness, vision changes, or confusion. These may be signs of a more serious problem.  To prevent headaches  Keep a headache diary so you can figure out what triggers your headaches. Avoiding triggers may help you prevent headaches. Record when each headache began, how long it lasted, and what the pain was like (throbbing, aching, stabbing, or dull). Write down any other symptoms you had with the headache, such as nausea, flashing lights or dark spots, or sensitivity to bright light or loud noise. Note if the headache occurred near your period. List anything that might have triggered the headache, such as certain foods (chocolate, cheese, wine) or odors, smoke, bright light, stress, or lack of sleep.  Find healthy ways to deal with stress. Headaches are most common during or right after stressful times. Take time to relax before and after you do something that has caused a headache in the past.  Try to keep your muscles relaxed by keeping good posture. Check your jaw, face, neck, and shoulder muscles for tension, and try relaxing them. When sitting at a desk, change positions often, and stretch for 30 seconds  each hour.  Get plenty of sleep and exercise.  Eat regularly and well. Long periods without food can trigger a headache.  Treat yourself to a massage. Some people find that regular massages are very helpful in relieving tension.  Limit caffeine by not drinking too much coffee, tea, or soda. But don't quit caffeine suddenly, because that can also give you headaches.  Reduce eyestrain from computers by blinking frequently and looking away from the computer screen every so often. Make sure you have proper eyewear and  that your monitor is set up properly, about an arm's length away.  Seek help if you have depression or anxiety. Your headaches may be linked to these conditions. Treatment can both prevent headaches and help with symptoms of anxiety or depression.

## 2019-03-16 NOTE — ED Provider Notes (Signed)
[facility] Emergency Department Provider Note  ____________________________________________  Time seen: Approximately 12:20 AM  I have reviewed the triage vital signs and the nursing notes.   HISTORY  Chief Complaint Headache   HPI Kendra Higgins is a 39 y.o. female who presents for evaluation of headache.  Patient reports that she has had intermittent headaches for the last month.  The headache has been constant for the last week.  She reports that she usually wakes up with a mild headache and throughout the day the headache becomes severe.  She has been taking ibuprofen at home with no significant relief.  She denies trauma, changes in vision, neck stiffness, dizziness, nausea or vomiting, or any neurological deficits associated with this headache.  She denies thunderclap headache.  She denies family history of aneurysms or brain tumor.  She denies drinking a lot of caffeine.  She denies alcohol or drug use.  She describes the headache as pressure generalized, currently 10 out of 10.  She currently is unemployed.  She denies being under a lot of stress.   She denies history of migraines or frequent headaches.  Past Medical History:  Diagnosis Date  . Heavy menstrual bleeding   . Meningitis spinal   . Shortness of breath dyspnea   . Toothache     There are no problems to display for this patient.   Past Surgical History:  Procedure Laterality Date  . UTERINE FIBROID SURGERY      Prior to Admission medications   Medication Sig Start Date End Date Taking? Authorizing Provider  amoxicillin (AMOXIL) 500 MG capsule Take 1 capsule (500 mg total) by mouth 3 (three) times daily. 10/31/18   Paulette Blanch, MD  butalbital-acetaminophen-caffeine (FIORICET) [phone] MG tablet Take 1-2 tablets by mouth every 6 (six) hours as needed for headache. 03/16/19 03/15/20  Rudene Re, MD  cyclobenzaprine (FLEXERIL) 10 MG tablet Take 1 tablet (10 mg total) by mouth 3  (three) times daily as needed for muscle spasms. 03/16/19   Rudene Re, MD  ibuprofen (ADVIL) 800 MG tablet Take 1 tablet (800 mg total) by mouth every 8 (eight) hours as needed for moderate pain. 10/31/18   Paulette Blanch, MD  oxyCODONE-acetaminophen (PERCOCET/ROXICET) 5-325 MG tablet Take 1 tablet by mouth every 4 (four) hours as needed for severe pain. 10/31/18   Paulette Blanch, MD    Allergies Tramadol  Family History  Problem Relation Age of Onset  . CVA Mother     Social History Social History   Tobacco Use  . Smoking status: Current Every Day Smoker    Packs/day: 0.50    Years: 20.00    Pack years: 10.00    Types: Cigarettes  . Smokeless tobacco: Never Used  Substance Use Topics  . Alcohol use: Yes  . Drug use: Yes    Frequency: 7.0 times per week    Types: Marijuana    Comment: ecstasy    Review of Systems  Constitutional: Negative for fever. Eyes: Negative for visual changes. ENT: Negative for sore throat. Neck: No neck pain  Cardiovascular: Negative for chest pain. Respiratory: Negative for shortness of breath. Gastrointestinal: Negative for abdominal pain, vomiting or diarrhea. Genitourinary: Negative for dysuria. Musculoskeletal: Negative for back pain. Skin: Negative for rash. Neurological: Negative for weakness or numbness. + HA Psych: No SI or HI  ____________________________________________   PHYSICAL EXAM:  VITAL SIGNS: ED Triage Vitals  Enc Vitals Group     BP 03/15/19 2207 Mar[place] Kitchen)  144/103     Pulse Rate 03/15/19 2207 89     Resp 03/15/19 2207 20     Temp 03/15/19 2207 98.4 F (36.9 C)     Temp Source 03/15/19 2207 Oral     SpO2 03/15/19 2207 100 %     Weight 03/15/19 2208 200 lb (90.7 kg)     Height 03/15/19 2208 5\' 3"  (1.6 m)     Head Circumference --      Peak Flow --      Pain Score 03/15/19 2208 10     Pain Loc --      Pain Edu? --      Excl. in [place]? --     Constitutional: Alert and oriented. Well appearing and in no apparent  distress. HEENT:      Head: Normocephalic and atraumatic.         Eyes: Conjunctivae are normal. Sclera is non-icteric.       Mouth/Throat: Mucous membranes are moist.       Neck: Supple with no signs of meningismus. Cardiovascular: Regular rate and rhythm. No murmurs, gallops, or rubs.  Respiratory: Normal respiratory effort. Lungs are clear to auscultation bilaterally Gastrointestinal: Soft, non tender, and non distended with positive bowel sounds.  Musculoskeletal: No edema, cyanosis, or erythema of extremities. Neurologic: Normal speech and language. Face is symmetric. EOMI, PERRL. Normal strength and sensation x4, no pronator drift, no dysmetria, normal gait. Skin: Skin is warm, dry and intact. No rash noted. Psychiatric: Mood and affect are normal. Speech and behavior are normal.  ____________________________________________   LABS (all labs ordered are listed, but only abnormal results are displayed)  Labs Reviewed  URINALYSIS, COMPLETE (UACMP) WITH MICROSCOPIC - Abnormal; Notable for the following components:      Result Value   Color, Urine YELLOW (*)    APPearance HAZY (*)    Leukocytes,Ua TRACE (*)    Bacteria, UA RARE (*)    All other components within normal limits  URINE DRUG SCREEN, QUALITATIVE ([facility] ONLY) - Abnormal; Notable for the following components:   Cannabinoid 50 Ng, [facility] [place] POSITIVE (*)    All other components within normal limits  URINE CULTURE  CBC WITH DIFFERENTIAL/PLATELET  BASIC METABOLIC PANEL  POCT PREGNANCY, URINE   ____________________________________________  EKG  none  ____________________________________________  RADIOLOGY  I have personally reviewed the images performed during this visit and I agree with the Radiologist's read.   Interpretation by Radiologist:  CT Head Wo Contrast  Result Date: 03/16/2019 CLINICAL DATA:  Headache for 1 month, photophobia EXAM: CT HEAD WITHOUT CONTRAST TECHNIQUE: Contiguous axial images were  obtained from the base of the skull through the vertex without intravenous contrast. COMPARISON:  None. FINDINGS: Brain: No acute infarct or hemorrhage. Lateral ventricles and midline structures are unremarkable. No acute extra-axial fluid collections. No mass effect. Vascular: No hyperdense vessel or unexpected calcification. Skull: Normal. Negative for fracture or focal lesion. Sinuses/Orbits: No acute finding. Other: None IMPRESSION: 1. No acute intracranial process. Electronically Signed   By: Randa Ngo M.D.   On: 03/16/2019 00:48      ____________________________________________   PROCEDURES  Procedure(s) performed: None Procedures Critical Care performed:  None ____________________________________________   INITIAL IMPRESSION / ASSESSMENT AND PLAN / ED COURSE  39 y.o. female who presents for evaluation of headache.  Stress HA vs tension HA vs migraines. Low suspicion for more serious or life threatening etiology of HA based on history and exam. No sudden onset thunderclap HA, onset with  exertion, vomiting, focal neurologic deficits, to suggest increased risk of subarachnoid hemorrhage. No fever, neck pain, neck stiffness, or meningismus on exam to suggest meningitis. No fevers, altered mental status, unusual behavior to suggest encephalitis. No focal neurologic deficits by history or exam to suggest central venous thrombosis. No constitutional symptoms including fever, fatigue, weight loss, temporal scalp tenderness, jaw claudication, visual loss, to suggest temporal arteritis. No immunocompromise to suggest increased risk for intracranial infectious disease. No visual changes or findings on ocular exam to suggest acute angle closure glaucoma. No reports of toxic exposures including carbon monoxide or other household members with similar symptoms.   Head CT normal. Labs WNL. UDS + cannabinoids.  Will treat with migraine cocktail.    _________________________ 2:13 AM on  03/16/2019 -----------------------------------------  Patient reevaluated and headache is fully resolved.  She remains extremely well-appearing.  Will discharge home on Fioricet and Flexeril.  Recommended close follow-up with PCP for further evaluation.  Discussed my standard return precautions.      As part of my medical decision making, I reviewed the following data within the [place] notes reviewed and incorporated, Labs reviewed , Old chart reviewed, Radiograph reviewed , Notes from prior ED visits and [place] Controlled Substance Database   Please note:  Patient was evaluated in Emergency Department today for the symptoms described in the history of present illness. Patient was evaluated in the context of the global COVID-19 pandemic, which necessitated consideration that the patient might be at risk for infection with the SARS-CoV-2 virus that causes COVID-19. Institutional protocols and algorithms that pertain to the evaluation of patients at risk for COVID-19 are in a state of rapid change based on information released by regulatory bodies including the CDC and federal and state organizations. These policies and algorithms were followed during the patient's care in the ED.  Some ED evaluations and interventions may be delayed as a result of limited staffing during the pandemic.   ____________________________________________   FINAL CLINICAL IMPRESSION(S) / ED DIAGNOSES   Final diagnoses:  Acute non intractable tension-type headache      NEW MEDICATIONS STARTED DURING THIS VISIT:  ED Discharge Orders         Ordered    cyclobenzaprine (FLEXERIL) 10 MG tablet  3 times daily PRN     03/16/19 0212    butalbital-acetaminophen-caffeine (FIORICET) 50-325-40 MG tablet  Every 6 hours PRN     03/16/19 0212           Note:  This document was prepared using Dragon voice recognition software and may include unintentional dictation errors.    Alfred Levins,  [place], MD 03/16/19 [phone]

## 2019-03-16 NOTE — ED Notes (Signed)
Fluids infusing and pt resting quietly.

## 2020-07-29 ENCOUNTER — Telehealth: Payer: Self-pay | Admitting: Emergency Medicine

## 2020-07-29 ENCOUNTER — Other Ambulatory Visit: Payer: Self-pay

## 2020-07-29 ENCOUNTER — Emergency Department
Admission: EM | Admit: 2020-07-29 | Discharge: 2020-07-29 | Disposition: A | Discharge status: Home / Self Care | Payer: Self-pay | Attending: Emergency Medicine | Admitting: Emergency Medicine

## 2020-07-29 DIAGNOSIS — R1110 Vomiting, unspecified: Secondary | ICD-10-CM | POA: Insufficient documentation

## 2020-07-29 DIAGNOSIS — E860 Dehydration: Secondary | ICD-10-CM | POA: Insufficient documentation

## 2020-07-29 DIAGNOSIS — Z5321 Procedure and treatment not carried out due to patient leaving prior to being seen by health care provider: Secondary | ICD-10-CM | POA: Insufficient documentation

## 2020-07-29 LAB — POC URINE PREG, ED: Preg Test, Ur: NEGATIVE

## 2020-07-29 LAB — URINALYSIS, COMPLETE (UACMP) WITH MICROSCOPIC
Bilirubin Urine: NEGATIVE
Glucose, UA: NEGATIVE mg/dL
Hgb urine dipstick: NEGATIVE
Ketones, ur: 20 mg/dL — AB
Leukocytes,Ua: NEGATIVE
Nitrite: NEGATIVE
Protein, ur: NEGATIVE mg/dL
Specific Gravity, Urine: 1.026 (ref 1.005–1.030)
pH: 5 (ref 5.0–8.0)

## 2020-07-29 LAB — COMPREHENSIVE METABOLIC PANEL
ALT: 21 U/L (ref 0–44)
AST: 24 U/L (ref 15–41)
Albumin: 3.7 g/dL (ref 3.5–5.0)
Alkaline Phosphatase: 57 U/L (ref 38–126)
Anion gap: 8 (ref 5–15)
BUN: 13 mg/dL (ref 6–20)
CO2: 23 mmol/L (ref 22–32)
Calcium: 8.8 mg/dL — ABNORMAL LOW (ref 8.9–10.3)
Chloride: 104 mmol/L (ref 98–111)
Creatinine, Ser: 1 mg/dL (ref 0.44–1.00)
GFR, Estimated: >60 mL/min (ref >60)
Glucose, Bld: 90 mg/dL (ref 70–99)
Potassium: 3.1 mmol/L — ABNORMAL LOW (ref 3.5–5.1)
Sodium: 135 mmol/L (ref 135–145)
Total Bilirubin: 0.5 mg/dL (ref 0.3–1.2)
Total Protein: 7.3 g/dL (ref 6.5–8.1)

## 2020-07-29 LAB — CBC
HCT: 44.6 % (ref 36.0–46.0)
Hemoglobin: 15.2 g/dL — ABNORMAL HIGH (ref 12.0–15.0)
MCH: 28.6 pg (ref 26.0–34.0)
MCHC: 34.1 g/dL (ref 30.0–36.0)
MCV: 83.8 fL (ref 80.0–100.0)
Platelets: 176 10*3/uL (ref 150–400)
RBC: 5.32 MIL/uL — ABNORMAL HIGH (ref 3.87–5.11)
RDW: 13.2 % (ref 11.5–15.5)
WBC: 2.6 10*3/uL — ABNORMAL LOW (ref 4.0–10.5)
nRBC: 0 % (ref 0.0–0.2)

## 2020-07-29 LAB — URINE DRUG SCREEN, QUALITATIVE (ARMC ONLY)
Amphetamines, Ur Screen: NOT DETECTED
Barbiturates, Ur Screen: NOT DETECTED
Benzodiazepine, Ur Scrn: NOT DETECTED
Cannabinoid 50 Ng, Ur ~~LOC~~: POSITIVE — AB
Cocaine Metabolite,Ur ~~LOC~~: NOT DETECTED
MDMA (Ecstasy)Ur Screen: NOT DETECTED
Methadone Scn, Ur: NOT DETECTED
Opiate, Ur Screen: NOT DETECTED
Phencyclidine (PCP) Ur S: NOT DETECTED
Tricyclic, Ur Screen: NOT DETECTED

## 2020-07-29 LAB — LIPASE, BLOOD: Lipase: 39 U/L (ref 11–51)

## 2020-07-29 NOTE — ED Notes (Signed)
No answer when called several times from lobby 

## 2020-07-29 NOTE — ED Triage Notes (Signed)
Pt in with co vomiting x 2 days, no diarrhea. States hx of the same and was dx with dehydration.

## 2020-07-29 NOTE — Telephone Encounter (Signed)
Called patient due to left emergency department before provider exam to inquire about condition and follow up plans. Left message. 

## 2020-07-29 NOTE — ED Notes (Signed)
No answer when called several times from lobby; no answer when phone # listed in chart called 

## 2021-10-05 IMAGING — CT CT HEAD W/O CM
3 series · 15 of 44 positions shown, 18 images · non-contrast
Comparison: None.

CLINICAL DATA: Headache for 1 month, photophobia

EXAM:
CT HEAD WITHOUT CONTRAST
TECHNIQUE: Contiguous axial images were obtained from the base of the skull
through the vertex without intravenous contrast.

[Series 2: head wo · axial · 0.42mm/px · z∈[-73,+37]mm · 9 of 27 slices shown, 12 images]
[im 3/27  brain]
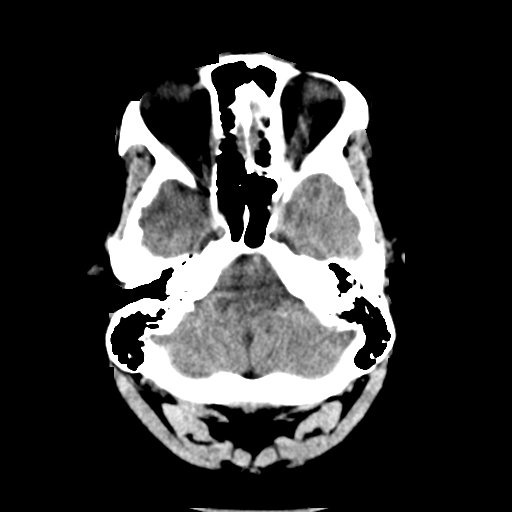
[im 3/27  bone]
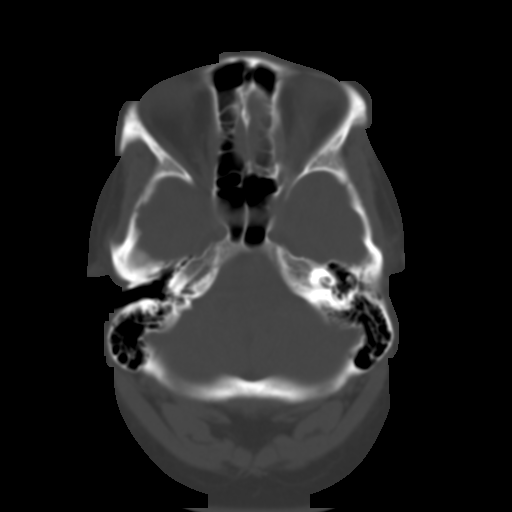
[im 6/27  brain]
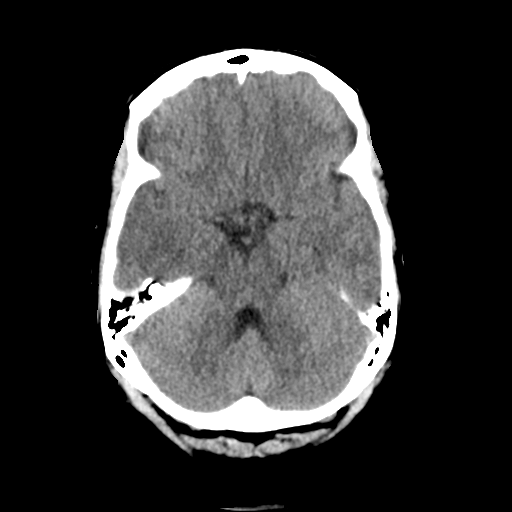
[im 8/27  brain]
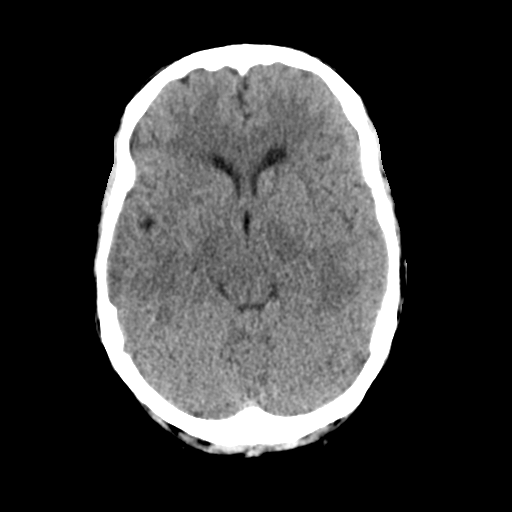
[im 11/27  brain]
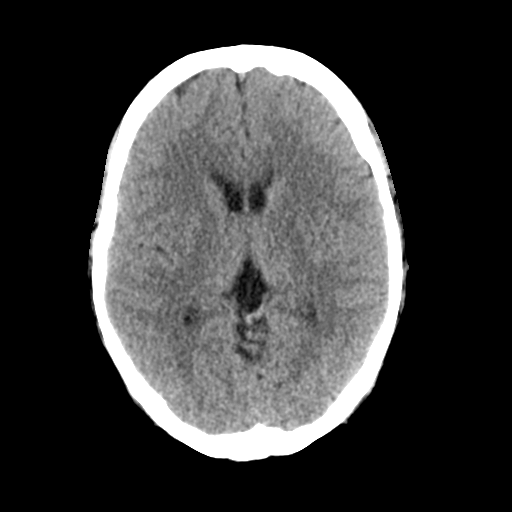
[im 14/27  brain]
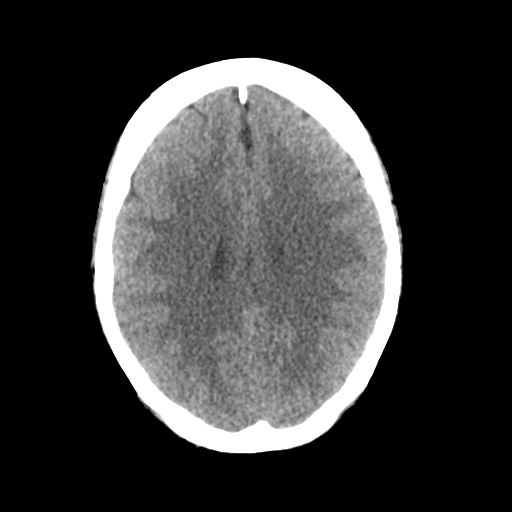
[im 14/27  bone]
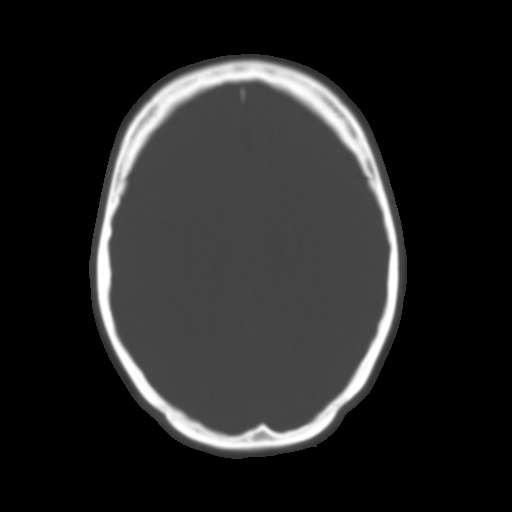
[im 17/27  brain]
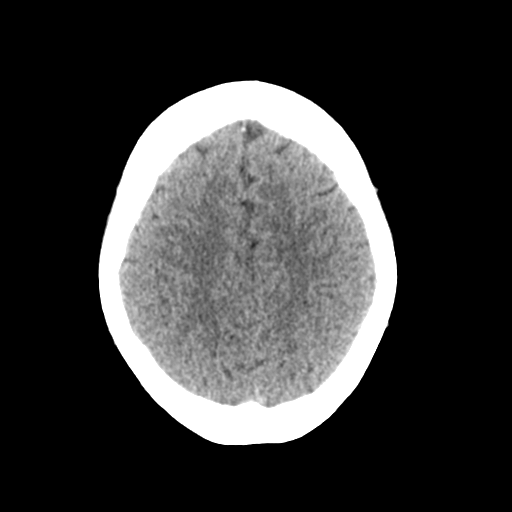
[im 20/27  brain]
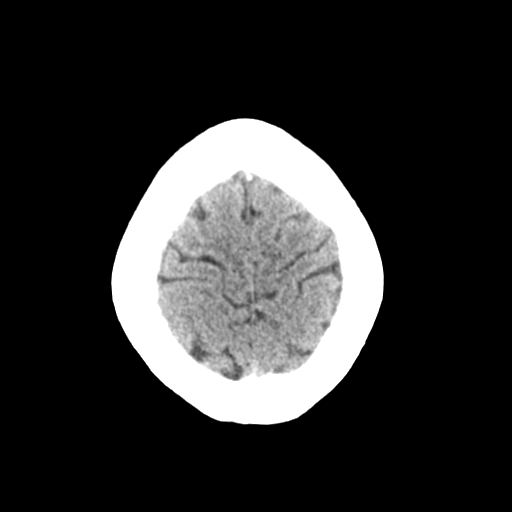
[im 22/27  brain]
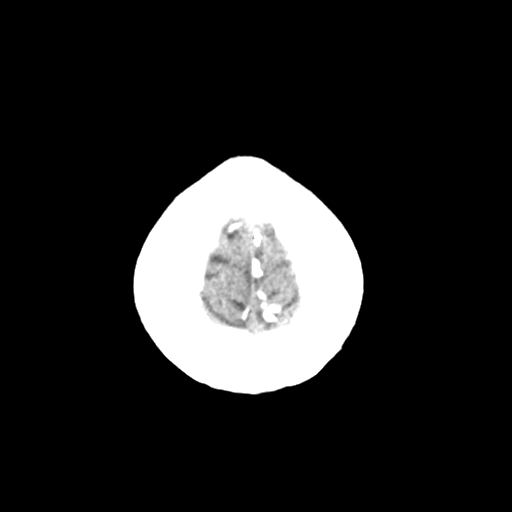
[im 25/27  brain]
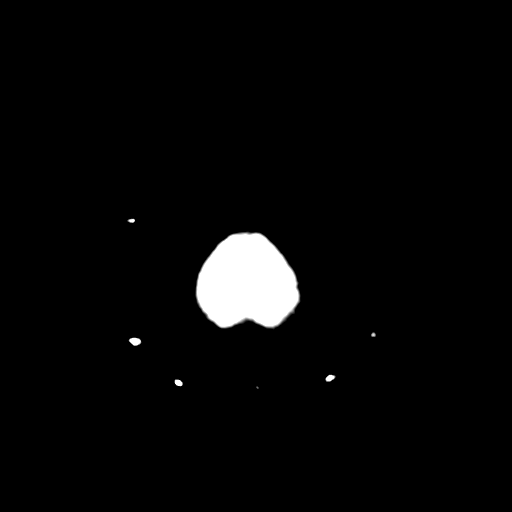
[im 25/27  bone]
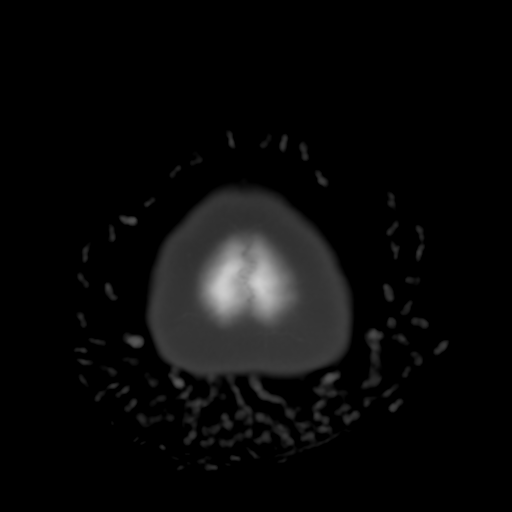

[Series 4: coronal soft tissue · coronal · 0.27mm/px · 3 of 58 slices shown]
[im 20/58  brain]
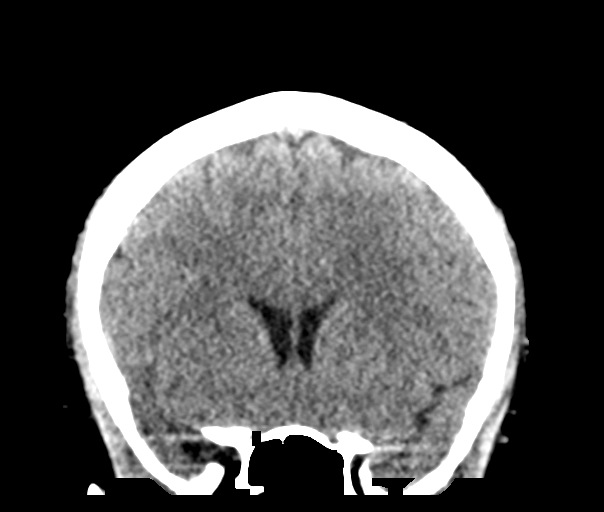
[im 26/58  brain]
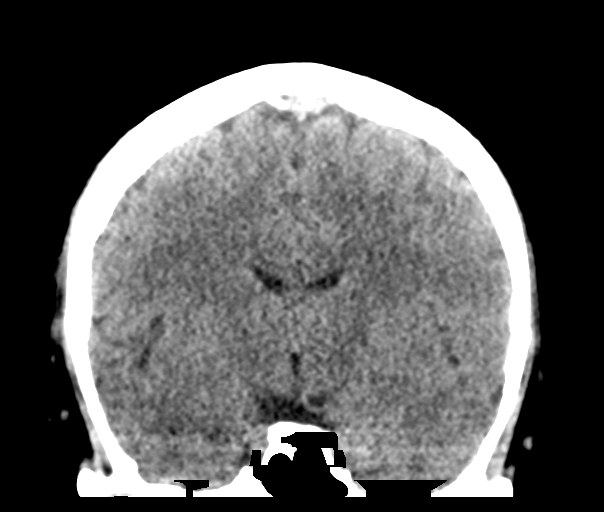
[im 32/58  brain]
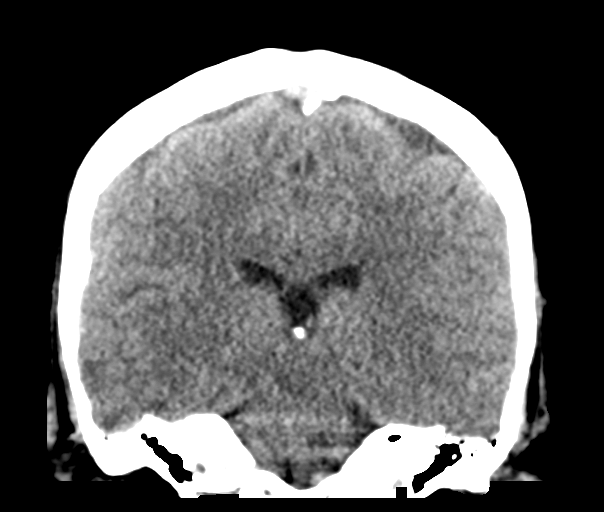

[Series 5: sagittal soft tissue · sagittal · 0.28mm/px · 3 of 49 slices shown]
[im 17/49  brain]
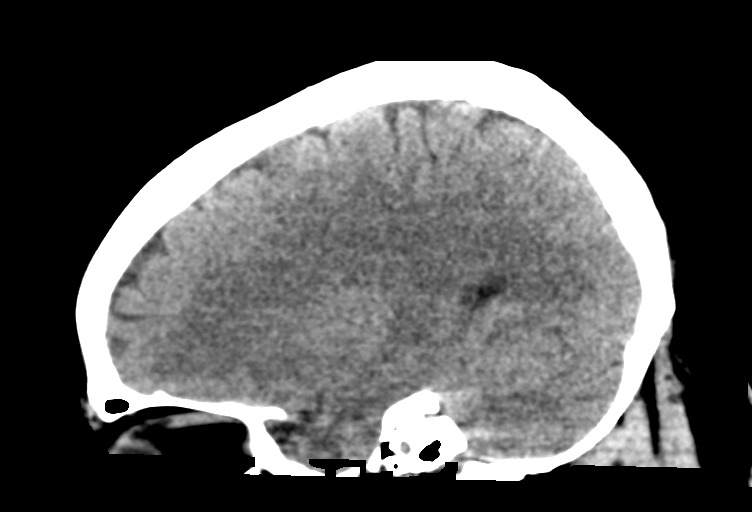
[im 25/49  brain]
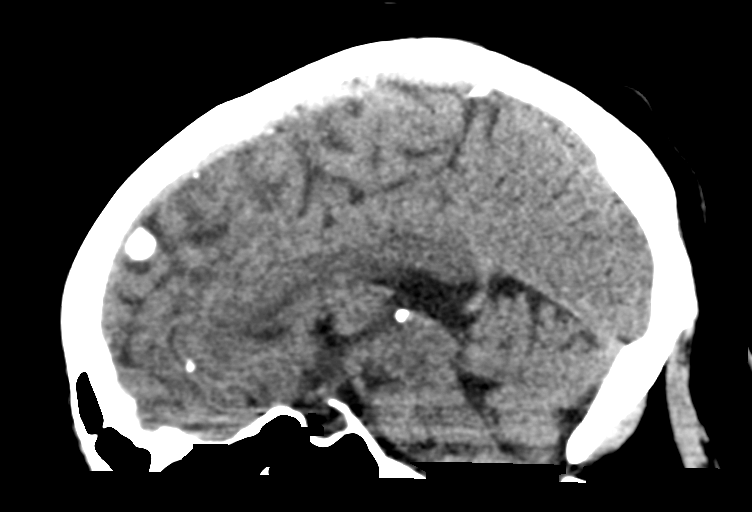
[im 33/49  brain]
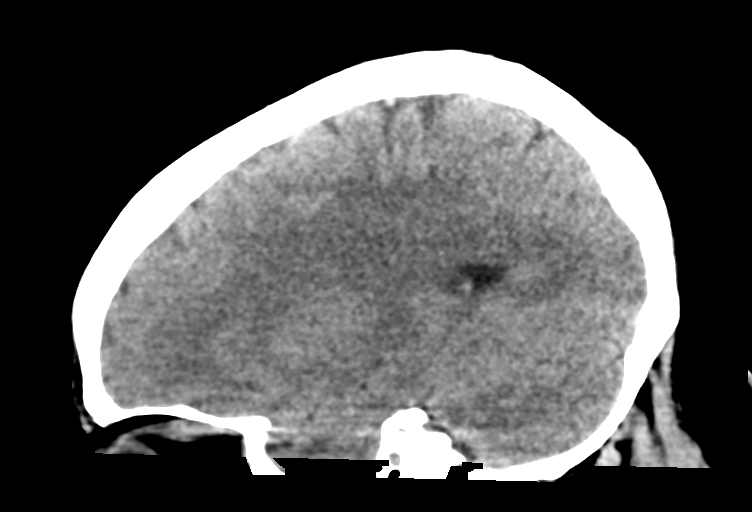

[15 of 44 positions shown; findings below may reference images not displayed]

FINDINGS: Brain: No acute infarct or hemorrhage. Lateral ventricles and
midline structures are unremarkable. No acute extra-axial fluid
collections. No mass effect.

Vascular: No hyperdense vessel or unexpected calcification.

Skull: Normal. Negative for fracture or focal lesion.

Sinuses/Orbits: No acute finding.

Other: None
IMPRESSION: 1. No acute intracranial process.
